# Patient Record
Sex: Female | Born: 1985 | Race: White | Hispanic: No | Marital: Single | Smoking: Former smoker
Health system: Southern US, Community
[De-identification: ages and names within clinical notes are randomized; demographics above are authoritative.]

## PROBLEM LIST (undated history)

## (undated) DIAGNOSIS — K219 Gastro-esophageal reflux disease without esophagitis: Secondary | ICD-10-CM

## (undated) DIAGNOSIS — F410 Panic disorder [episodic paroxysmal anxiety] without agoraphobia: Secondary | ICD-10-CM

## (undated) DIAGNOSIS — R51 Headache: Secondary | ICD-10-CM

## (undated) DIAGNOSIS — J45909 Unspecified asthma, uncomplicated: Secondary | ICD-10-CM

## (undated) DIAGNOSIS — G5601 Carpal tunnel syndrome, right upper limb: Secondary | ICD-10-CM

## (undated) DIAGNOSIS — R519 Headache, unspecified: Secondary | ICD-10-CM

## (undated) HISTORY — PX: DENTAL SURGERY: SHX609

## (undated) HISTORY — PX: OVARIAN CYST REMOVAL: SHX89

---

## 2006-02-05 ENCOUNTER — Emergency Department: Payer: Self-pay | Admitting: Internal Medicine

## 2006-02-14 ENCOUNTER — Emergency Department: Payer: Self-pay | Admitting: Emergency Medicine

## 2007-09-18 ENCOUNTER — Emergency Department (HOSPITAL_COMMUNITY): Admission: EM | Admit: 2007-09-18 | Discharge: 2007-09-18 | Payer: Self-pay | Admitting: Emergency Medicine

## 2009-03-17 ENCOUNTER — Emergency Department (HOSPITAL_COMMUNITY): Admission: EM | Admit: 2009-03-17 | Discharge: 2009-03-17 | Payer: Self-pay | Admitting: Emergency Medicine

## 2009-03-28 ENCOUNTER — Ambulatory Visit: Payer: Self-pay | Admitting: Unknown Physician Specialty

## 2009-03-30 ENCOUNTER — Ambulatory Visit: Payer: Self-pay | Admitting: Unknown Physician Specialty

## 2011-02-19 LAB — COMPREHENSIVE METABOLIC PANEL
Alkaline Phosphatase: 70 U/L (ref 39–117)
BUN: 9 mg/dL (ref 6–23)
Chloride: 106 mEq/L (ref 96–112)
Creatinine, Ser: 0.74 mg/dL (ref 0.4–1.2)
Glucose, Bld: 90 mg/dL (ref 70–99)
Potassium: 3.7 mEq/L (ref 3.5–5.1)
Total Bilirubin: 0.5 mg/dL (ref 0.3–1.2)
Total Protein: 6.1 g/dL (ref 6.0–8.3)

## 2011-02-19 LAB — URINALYSIS, ROUTINE W REFLEX MICROSCOPIC
Nitrite: NEGATIVE
Protein, ur: NEGATIVE mg/dL
Specific Gravity, Urine: 1.011 (ref 1.005–1.030)
Urobilinogen, UA: 0.2 mg/dL (ref 0.0–1.0)

## 2011-02-19 LAB — DIFFERENTIAL
Basophils Absolute: 0.1 10*3/uL (ref 0.0–0.1)
Basophils Relative: 1 % (ref 0–1)
Lymphocytes Relative: 19 % (ref 12–46)
Neutro Abs: 7.7 10*3/uL (ref 1.7–7.7)
Neutrophils Relative %: 72 % (ref 43–77)

## 2011-02-19 LAB — CBC
HCT: 38.8 % (ref 36.0–46.0)
Hemoglobin: 13.5 g/dL (ref 12.0–15.0)
MCV: 89.3 fL (ref 78.0–100.0)
Platelets: 228 10*3/uL (ref 150–400)
RDW: 13 % (ref 11.5–15.5)

## 2011-02-19 LAB — URINE MICROSCOPIC-ADD ON

## 2011-08-20 LAB — GC/CHLAMYDIA PROBE AMP, GENITAL
Chlamydia, DNA Probe: NEGATIVE
GC Probe Amp, Genital: NEGATIVE

## 2015-11-21 ENCOUNTER — Encounter: Payer: Self-pay | Admitting: *Deleted

## 2015-12-01 ENCOUNTER — Ambulatory Visit: Payer: Medicaid Other | Admitting: Anesthesiology

## 2015-12-01 ENCOUNTER — Ambulatory Visit
Admission: RE | Admit: 2015-12-01 | Discharge: 2015-12-01 | Disposition: A | Discharge status: Home / Self Care | Payer: Medicaid Other | Source: Ambulatory Visit | Attending: Unknown Physician Specialty | Admitting: Unknown Physician Specialty

## 2015-12-01 ENCOUNTER — Encounter
Admission: RE | Disposition: A | Discharge status: Home / Self Care | Payer: Self-pay | Source: Ambulatory Visit | Attending: Unknown Physician Specialty

## 2015-12-01 DIAGNOSIS — Z809 Family history of malignant neoplasm, unspecified: Secondary | ICD-10-CM | POA: Diagnosis not present

## 2015-12-01 DIAGNOSIS — G5601 Carpal tunnel syndrome, right upper limb: Secondary | ICD-10-CM | POA: Insufficient documentation

## 2015-12-01 DIAGNOSIS — Z91030 Bee allergy status: Secondary | ICD-10-CM | POA: Insufficient documentation

## 2015-12-01 DIAGNOSIS — Z79899 Other long term (current) drug therapy: Secondary | ICD-10-CM | POA: Insufficient documentation

## 2015-12-01 HISTORY — DX: Headache: R51

## 2015-12-01 HISTORY — DX: Carpal tunnel syndrome, right upper limb: G56.01

## 2015-12-01 HISTORY — PX: CARPAL TUNNEL RELEASE: SHX101

## 2015-12-01 HISTORY — DX: Headache, unspecified: R51.9

## 2015-12-01 HISTORY — DX: Gastro-esophageal reflux disease without esophagitis: K21.9

## 2015-12-01 SURGERY — CARPAL TUNNEL RELEASE
Anesthesia: General | Laterality: Right | Wound class: Clean

## 2015-12-01 MED ORDER — LIDOCAINE HCL (CARDIAC) 20 MG/ML IV SOLN
INTRAVENOUS | Status: DC | PRN
  Administered 2015-12-01 (×2): 50 mg via INTRATRACHEAL

## 2015-12-01 MED ORDER — ONDANSETRON HCL 4 MG/2ML IJ SOLN
INTRAMUSCULAR | Status: DC | PRN
  Administered 2015-12-01: 4 mg via INTRAVENOUS

## 2015-12-01 MED ORDER — FENTANYL CITRATE (PF) 100 MCG/2ML IJ SOLN
INTRAMUSCULAR | Status: DC | PRN
  Administered 2015-12-01: 50 ug via INTRAVENOUS

## 2015-12-01 MED ORDER — NORCO 5-325 MG PO TABS: 1.0000 | ORAL_TABLET | Freq: Four times a day (QID) | ORAL | Status: DC | PRN

## 2015-12-01 MED ORDER — PROPOFOL 10 MG/ML IV BOLUS
INTRAVENOUS | Status: DC | PRN
  Administered 2015-12-01: 150 mg via INTRAVENOUS

## 2015-12-01 MED ORDER — DEXAMETHASONE SODIUM PHOSPHATE 4 MG/ML IJ SOLN
INTRAMUSCULAR | Status: DC | PRN
  Administered 2015-12-01: 8 mg via INTRAVENOUS

## 2015-12-01 MED ORDER — BUPIVACAINE HCL (PF) 0.5 % IJ SOLN
INTRAMUSCULAR | Status: DC | PRN
  Administered 2015-12-01: 6 mL

## 2015-12-01 MED ORDER — LACTATED RINGERS IV SOLN
INTRAVENOUS | Status: DC
  Administered 2015-12-01: 07:00:00 via INTRAVENOUS

## 2015-12-01 MED ORDER — MIDAZOLAM HCL 5 MG/5ML IJ SOLN
INTRAMUSCULAR | Status: DC | PRN
  Administered 2015-12-01: 2 mg via INTRAVENOUS

## 2015-12-01 MED ORDER — ONDANSETRON HCL 4 MG/2ML IJ SOLN: 4.0000 mg | Freq: Once | INTRAMUSCULAR | Status: DC | PRN

## 2015-12-01 MED ORDER — FENTANYL CITRATE (PF) 100 MCG/2ML IJ SOLN
25.0000 ug | INTRAMUSCULAR | Status: DC | PRN
  Administered 2015-12-01: 25 ug via INTRAVENOUS

## 2015-12-01 SURGICAL SUPPLY — 27 items
BANDAGE ELASTIC 2 CLIP NS LF (GAUZE/BANDAGES/DRESSINGS) ×3 IMPLANT
BNDG ESMARK 4X12 TAN STRL LF (GAUZE/BANDAGES/DRESSINGS) ×3 IMPLANT
COVER LIGHT HANDLE FLEXIBLE (MISCELLANEOUS) ×3 IMPLANT
CUFF TOURN SGL QUICK 18 (TOURNIQUET CUFF) ×3 IMPLANT
DURAPREP 26ML APPLICATOR (WOUND CARE) ×3 IMPLANT
GAUZE SPONGE 4X4 12PLY STRL (GAUZE/BANDAGES/DRESSINGS) ×3 IMPLANT
GLOVE BIO SURGEON STRL SZ7.5 (GLOVE) ×3 IMPLANT
GLOVE BIO SURGEON STRL SZ8 (GLOVE) ×6 IMPLANT
GLOVE INDICATOR 8.0 STRL GRN (GLOVE) ×3 IMPLANT
GOWN STRL REUS W/ TWL LRG LVL3 (GOWN DISPOSABLE) ×2 IMPLANT
GOWN STRL REUS W/TWL LRG LVL3 (GOWN DISPOSABLE) ×4
KIT ROOM TURNOVER OR (KITS) ×3 IMPLANT
LOOP VESSEL RED MINI 1.3X0.9 (MISCELLANEOUS) ×1 IMPLANT
LOOPS RED MINI 1.3MMX0.9MM (MISCELLANEOUS) ×2
NS IRRIG 500ML POUR BTL (IV SOLUTION) ×3 IMPLANT
PACK EXTREMITY ARMC (MISCELLANEOUS) ×3 IMPLANT
PAD GROUND ADULT SPLIT (MISCELLANEOUS) ×3 IMPLANT
PADDING CAST 2X4YD ST (MISCELLANEOUS) ×2
PADDING CAST BLEND 2X4 STRL (MISCELLANEOUS) ×1 IMPLANT
SOL PREP PVP 2OZ (MISCELLANEOUS) ×3
SOLUTION PREP PVP 2OZ (MISCELLANEOUS) ×1 IMPLANT
SPLINT CAST 1 STEP 3X12 (MISCELLANEOUS) ×3 IMPLANT
STOCKINETTE 4X48 STRL (DRAPES) ×3 IMPLANT
STRAP BODY AND KNEE 60X3 (MISCELLANEOUS) ×3 IMPLANT
SUT ETHILON 4-0 (SUTURE) ×2
SUT ETHILON 4-0 FS2 18XMFL BLK (SUTURE) ×1
SUTURE ETHLN 4-0 FS2 18XMF BLK (SUTURE) ×1 IMPLANT

## 2015-12-01 NOTE — H&P (Signed)
  H and P reviewed. No changes. Uploaded at later date. 

## 2015-12-01 NOTE — Op Note (Signed)
DATE OF SURGERY:  12/01/2015  PATIENT NAME:  Lindsey Clark   DOB: 01-09-1986  MRN: [id number]  PRE-OPERATIVE DIAGNOSIS: Right carpal tunnel syndrome  POST-OPERATIVE DIAGNOSIS:  Same  PROCEDURE: Right carpal tunnel release  SURGEON: Dr. Erin Sons, Montez Hageman. M.D.  ANESTHESIA: Gen.   INDICATIONS FOR SURGERY: Lindsey Clark is a 30 y.o. year old female with a long history of numbness and paresthesias in the right hand. Nerve conduction studies demonstrated findings consistent with  median nerve compression.The patient had not seen any significant improvement despite conservative nonsurgical intervention. After discussion of the risks and benefits of surgical intervention, the patient expressed understanding of the risks benefits and agree with plans for carpal tunnel release.   PROCEDURE IN DETAIL: The patient was brought into the operating room and general anesthesia was performed. A tourniquet was placed on the patient's right upper arm.The right hand and arm were prepped  and draped in the usual sterile fashion. A "time-out" was performed as per usual protocol. The hand and forearm were exsanguinated using an Esmarch and the tourniquet was inflated to 250 mmHg.  An incision was made just ulnar to the thenar palmar crease. Dissection was carried down through the palmar fascia to the transverse carpal ligament. The transverse carpal ligament was sharply incised, taking care to protect the underlying structures within the carpal tunnel. Complete release of the transverse carpal ligament was achieved. There was no evidence of a mass or proliferative synovitis within the carpal tunnel. I also explored the ulnar nerve at the level of the incision and freed it up from the surrounding soft tissue. The wound was irrigated with saline. The tourniquet was released at this time. It had been up for about 10 minutes. Bleeding was controlled with coagulation cautery. I did inject the subcutaneous tissue of the  wound with about 10 cc of 0.5% Marcaine without epinephrine. The skin was then re-approximated with interrupted sutures of #4-0 nylon. A sterile dressing was applied followed by application of a volar splint.  The patient tolerated the procedure well and was transported to the PACU in stable condition.  Dr. Isidoro Donning. M.D.

## 2015-12-01 NOTE — Anesthesia Postprocedure Evaluation (Signed)
Anesthesia Post Note  Patient: Lindsey Clark  Procedure(s) Performed: Procedure(s) (LRB): CARPAL TUNNEL RELEASE (Right)  Patient location during evaluation: PACU Anesthesia Type: General Level of consciousness: awake and alert and oriented Pain management: satisfactory to patient Vital Signs Assessment: post-procedure vital signs reviewed and stable Respiratory status: spontaneous breathing, nonlabored ventilation and respiratory function stable Cardiovascular status: blood pressure returned to baseline and stable Postop Assessment: Adequate PO intake and No signs of nausea or vomiting Anesthetic complications: no    Cherly Beach

## 2015-12-01 NOTE — Discharge Instructions (Signed)
General Anesthesia, Adult, Care After °Refer to this sheet in the next few weeks. These instructions provide you with information on caring for yourself after your procedure. Your health care provider may also give you more specific instructions. Your treatment has been planned according to current medical practices, but problems sometimes occur. Call your health care provider if you have any problems or questions after your procedure. °WHAT TO EXPECT AFTER THE PROCEDURE °After the procedure, it is typical to experience: °· Sleepiness. °· Nausea and vomiting. °HOME CARE INSTRUCTIONS °· For the first 24 hours after general anesthesia: °¨ Have a responsible person with you. °¨ Do not drive a car. If you are alone, do not take public transportation. °¨ Do not drink alcohol. °¨ Do not take medicine that has not been prescribed by your health care provider. °¨ Do not sign important papers or make important decisions. °¨ You may resume a normal diet and activities as directed by your health care provider. °· Change bandages (dressings) as directed. °· If you have questions or problems that seem related to general anesthesia, call the hospital and ask for the anesthetist or anesthesiologist on call. °SEEK MEDICAL CARE IF: °· You have nausea and vomiting that continue the day after anesthesia. °· You develop a rash. °SEEK IMMEDIATE MEDICAL CARE IF:  °· You have difficulty breathing. °· You have chest pain. °· You have any allergic problems. °  °This information is not intended to replace advice given to you by your health care provider. Make sure you discuss any questions you have with your health care provider. °  °Document Released: 02/03/2001 Document Revised: 11/18/2014 Document Reviewed: 02/26/2012 °Elsevier Interactive Patient Education ©2016 [employer] ° °Ice pack  ° °Elevation ° °Keep dressing dry  ° °RTC in about 10 days °

## 2015-12-01 NOTE — Transfer of Care (Signed)
Immediate Anesthesia Transfer of Care Note  Patient: Lindsey Clark  Procedure(s) Performed: Procedure(s): CARPAL TUNNEL RELEASE (Right)  Patient Location: PACU  Anesthesia Type: General LMA  Level of Consciousness: awake, alert  and patient cooperative  Airway and Oxygen Therapy: Patient Spontanous Breathing and Patient connected to supplemental oxygen  Post-op Assessment: Post-op Vital signs reviewed, Patient's Cardiovascular Status Stable, Respiratory Function Stable, Patent Airway and No signs of Nausea or vomiting  Post-op Vital Signs: Reviewed and stable  Complications: No apparent anesthesia complications

## 2015-12-01 NOTE — Anesthesia Procedure Notes (Signed)
Procedure Name: LMA Insertion Date/Time: 12/01/2015 7:37 AM Performed by: Andee Poles Pre-anesthesia Checklist: Patient identified, Emergency Drugs available, Suction available, Timeout performed and Patient being monitored Patient Re-evaluated:Patient Re-evaluated prior to inductionOxygen Delivery Method: Circle system utilized Preoxygenation: Pre-oxygenation with 100% oxygen Intubation Type: IV induction LMA: LMA inserted LMA Size: 4.0 Number of attempts: 1 Placement Confirmation: positive ETCO2 and breath sounds checked- equal and bilateral Tube secured with: Tape

## 2015-12-01 NOTE — Anesthesia Preprocedure Evaluation (Signed)
Anesthesia Evaluation  Patient identified by MRN, date of birth, ID band  Reviewed: Allergy & Precautions, H&P , NPO status , Patient's Chart, lab work & pertinent test results  Airway Mallampati: II  TM Distance: >3 FB Neck ROM: full    Dental no notable dental hx.    Pulmonary former smoker,    Pulmonary exam normal        Cardiovascular  Rhythm:regular Rate:Normal     Neuro/Psych    GI/Hepatic GERD  ,  Endo/Other    Renal/GU      Musculoskeletal   Abdominal   Peds  Hematology   Anesthesia Other Findings   Reproductive/Obstetrics                             Anesthesia Physical Anesthesia Plan  ASA: II  Anesthesia Plan: General LMA   Post-op Pain Management:    Induction:   Airway Management Planned:   Additional Equipment:   Intra-op Plan:   Post-operative Plan:   Informed Consent: I have reviewed the patients History and Physical, chart, labs and discussed the procedure including the risks, benefits and alternatives for the proposed anesthesia with the patient or authorized representative who has indicated his/her understanding and acceptance.     Plan Discussed with: CRNA  Anesthesia Plan Comments:         Anesthesia Quick Evaluation

## 2015-12-04 ENCOUNTER — Encounter: Payer: Self-pay | Admitting: Unknown Physician Specialty

## 2016-11-20 ENCOUNTER — Other Ambulatory Visit: Payer: Self-pay | Admitting: Surgery

## 2016-11-20 DIAGNOSIS — M67821 Other specified disorders of synovium, right elbow: Secondary | ICD-10-CM

## 2016-12-03 ENCOUNTER — Ambulatory Visit
Admission: RE | Admit: 2016-12-03 | Discharge: 2016-12-03 | Disposition: A | Discharge status: Home / Self Care | Payer: Managed Care, Other (non HMO) | Source: Ambulatory Visit | Attending: Surgery | Admitting: Surgery

## 2016-12-03 DIAGNOSIS — M67821 Other specified disorders of synovium, right elbow: Secondary | ICD-10-CM | POA: Diagnosis not present

## 2016-12-23 ENCOUNTER — Encounter: Payer: Self-pay | Admitting: *Deleted

## 2016-12-25 ENCOUNTER — Ambulatory Visit: Payer: Managed Care, Other (non HMO) | Admitting: Anesthesiology

## 2016-12-25 ENCOUNTER — Ambulatory Visit
Admission: RE | Admit: 2016-12-25 | Discharge: 2016-12-25 | Disposition: A | Discharge status: Home / Self Care | Payer: Managed Care, Other (non HMO) | Source: Ambulatory Visit | Attending: Surgery | Admitting: Surgery

## 2016-12-25 ENCOUNTER — Encounter
Admission: RE | Disposition: A | Discharge status: Home / Self Care | Payer: Self-pay | Source: Ambulatory Visit | Attending: Surgery

## 2016-12-25 DIAGNOSIS — M25521 Pain in right elbow: Secondary | ICD-10-CM | POA: Diagnosis present

## 2016-12-25 DIAGNOSIS — K219 Gastro-esophageal reflux disease without esophagitis: Secondary | ICD-10-CM | POA: Diagnosis not present

## 2016-12-25 DIAGNOSIS — Z87891 Personal history of nicotine dependence: Secondary | ICD-10-CM | POA: Diagnosis not present

## 2016-12-25 DIAGNOSIS — Z91030 Bee allergy status: Secondary | ICD-10-CM | POA: Insufficient documentation

## 2016-12-25 DIAGNOSIS — Z79899 Other long term (current) drug therapy: Secondary | ICD-10-CM | POA: Diagnosis not present

## 2016-12-25 DIAGNOSIS — M67821 Other specified disorders of synovium, right elbow: Secondary | ICD-10-CM | POA: Insufficient documentation

## 2016-12-25 DIAGNOSIS — Z791 Long term (current) use of non-steroidal anti-inflammatories (NSAID): Secondary | ICD-10-CM | POA: Diagnosis not present

## 2016-12-25 DIAGNOSIS — Z9889 Other specified postprocedural states: Secondary | ICD-10-CM | POA: Diagnosis not present

## 2016-12-25 HISTORY — PX: ELBOW ARTHROSCOPY: SHX614

## 2016-12-25 SURGERY — ARTHROSCOPY, ELBOW, WITH OPEN SURGERY IF INDICATED
Anesthesia: Regional | Site: Elbow | Laterality: Right | Wound class: Clean

## 2016-12-25 MED ORDER — LIDOCAINE HCL (CARDIAC) 20 MG/ML IV SOLN
INTRAVENOUS | Status: DC | PRN
  Administered 2016-12-25: 40 mg via INTRAVENOUS

## 2016-12-25 MED ORDER — DEXTROSE 5 % IV SOLN
2000.0000 mg | Freq: Once | INTRAVENOUS | Status: AC
  Administered 2016-12-25: 2000 mg via INTRAVENOUS

## 2016-12-25 MED ORDER — ROPIVACAINE HCL 5 MG/ML IJ SOLN
INTRAMUSCULAR | Status: DC | PRN
  Administered 2016-12-25: 35 mL via PERINEURAL

## 2016-12-25 MED ORDER — HYDROCODONE-ACETAMINOPHEN 5-325 MG PO TABS: 1.0000 | ORAL_TABLET | ORAL | Status: DC | PRN

## 2016-12-25 MED ORDER — ONDANSETRON HCL 4 MG/2ML IJ SOLN: 4.0000 mg | Freq: Once | INTRAMUSCULAR | Status: DC | PRN

## 2016-12-25 MED ORDER — POTASSIUM CHLORIDE IN NACL 20-0.9 MEQ/L-% IV SOLN: INTRAVENOUS | Status: DC

## 2016-12-25 MED ORDER — BUPIVACAINE HCL 0.25 % IJ SOLN
INTRAMUSCULAR | Status: DC | PRN
  Administered 2016-12-25: 30 mL

## 2016-12-25 MED ORDER — ONDANSETRON HCL 4 MG/2ML IJ SOLN
INTRAMUSCULAR | Status: DC | PRN
  Administered 2016-12-25: 4 mg via INTRAVENOUS

## 2016-12-25 MED ORDER — LACTATED RINGERS IV SOLN: 500.0000 mL | INTRAVENOUS | Status: DC

## 2016-12-25 MED ORDER — DEXAMETHASONE SODIUM PHOSPHATE 4 MG/ML IJ SOLN
INTRAMUSCULAR | Status: DC | PRN
  Administered 2016-12-25: 4 mg via INTRAVENOUS

## 2016-12-25 MED ORDER — METOCLOPRAMIDE HCL 5 MG/ML IJ SOLN: 5.0000 mg | Freq: Three times a day (TID) | INTRAMUSCULAR | Status: DC | PRN

## 2016-12-25 MED ORDER — HYDROCODONE-ACETAMINOPHEN 5-325 MG PO TABS: 1.0000 | ORAL_TABLET | Freq: Four times a day (QID) | ORAL | 0 refills | Status: DC | PRN

## 2016-12-25 MED ORDER — ONDANSETRON HCL 4 MG PO TABS: 4.0000 mg | ORAL_TABLET | Freq: Four times a day (QID) | ORAL | Status: DC | PRN

## 2016-12-25 MED ORDER — EPINEPHRINE PF 1 MG/ML IJ SOLN
INTRAMUSCULAR | Status: DC | PRN
  Administered 2016-12-25: .15 mL via INTRAMUSCULAR

## 2016-12-25 MED ORDER — PROPOFOL 10 MG/ML IV BOLUS
INTRAVENOUS | Status: DC | PRN
Start: 2016-12-25 — End: 2016-12-25
  Administered 2016-12-25: 50 mg via INTRAVENOUS
  Administered 2016-12-25: 150 mg via INTRAVENOUS

## 2016-12-25 MED ORDER — ACETAMINOPHEN 325 MG PO TABS: 325.0000 mg | ORAL_TABLET | ORAL | Status: DC | PRN

## 2016-12-25 MED ORDER — ONDANSETRON HCL 4 MG/2ML IJ SOLN: 4.0000 mg | Freq: Four times a day (QID) | INTRAMUSCULAR | Status: DC | PRN

## 2016-12-25 MED ORDER — GLYCOPYRROLATE 0.2 MG/ML IJ SOLN
INTRAMUSCULAR | Status: DC | PRN
  Administered 2016-12-25: 0.1 mg via INTRAVENOUS

## 2016-12-25 MED ORDER — ACETAMINOPHEN 160 MG/5ML PO SOLN: 325.0000 mg | ORAL | Status: DC | PRN

## 2016-12-25 MED ORDER — OXYCODONE HCL 5 MG PO TABS: 5.0000 mg | ORAL_TABLET | Freq: Once | ORAL | Status: DC | PRN

## 2016-12-25 MED ORDER — LACTATED RINGERS IV SOLN
INTRAVENOUS | Status: DC | PRN
  Administered 2016-12-25: 1200 mL

## 2016-12-25 MED ORDER — OXYCODONE HCL 5 MG/5ML PO SOLN: 5.0000 mg | Freq: Once | ORAL | Status: DC | PRN

## 2016-12-25 MED ORDER — MIDAZOLAM HCL 2 MG/2ML IJ SOLN
INTRAMUSCULAR | Status: DC | PRN
  Administered 2016-12-25 (×2): 2 mg via INTRAVENOUS

## 2016-12-25 MED ORDER — METOCLOPRAMIDE HCL 5 MG PO TABS: 5.0000 mg | ORAL_TABLET | Freq: Three times a day (TID) | ORAL | Status: DC | PRN

## 2016-12-25 MED ORDER — LACTATED RINGERS IV SOLN
INTRAVENOUS | Status: DC
  Administered 2016-12-25: 13:00:00 via INTRAVENOUS

## 2016-12-25 MED ORDER — FENTANYL CITRATE (PF) 100 MCG/2ML IJ SOLN: 25.0000 ug | INTRAMUSCULAR | Status: DC | PRN

## 2016-12-25 MED ORDER — FENTANYL CITRATE (PF) 100 MCG/2ML IJ SOLN
INTRAMUSCULAR | Status: DC | PRN
  Administered 2016-12-25: 100 ug via INTRAVENOUS

## 2016-12-25 MED ORDER — LIDOCAINE HCL 4 % MT SOLN
OROMUCOSAL | Status: DC | PRN
  Administered 2016-12-25: 4 mL via TOPICAL

## 2016-12-25 MED ORDER — ROCURONIUM BROMIDE 100 MG/10ML IV SOLN
INTRAVENOUS | Status: DC | PRN
  Administered 2016-12-25: 20 mg via INTRAVENOUS

## 2016-12-25 SURGICAL SUPPLY — 38 items
BANDAGE ELASTIC 4 CLIP ST LF (GAUZE/BANDAGES/DRESSINGS) ×3 IMPLANT
BLADE EAR TYMPAN 2.5 60D BEAV (BLADE) ×3 IMPLANT
BLADE FULL RADIUS 3.5 (BLADE) ×3 IMPLANT
BNDG ESMARK 4X12 TAN STRL LF (GAUZE/BANDAGES/DRESSINGS) ×3 IMPLANT
BUR ABRADER 4.0 W/FLUTE AQUA (MISCELLANEOUS) IMPLANT
BURR ABRADER 4.0 W/FLUTE AQUA (MISCELLANEOUS)
CANNULA SHAVER SCOPE W/RIB (CANNULA) ×3 IMPLANT
CHLORAPREP W/TINT 26ML (MISCELLANEOUS) ×6 IMPLANT
COVER LIGHT HANDLE UNIVERSAL (MISCELLANEOUS) ×6 IMPLANT
CUFF TOURN SGL QUICK 18 (TOURNIQUET CUFF) ×3 IMPLANT
DRAPE IMP U-DRAPE 54X76 (DRAPES) ×3 IMPLANT
DRAPE SHEET LG 3/4 BI-LAMINATE (DRAPES) ×3 IMPLANT
DRAPE SURG 17X11 SM STRL (DRAPES) ×3 IMPLANT
GAUZE SPONGE 4X4 12PLY STRL (GAUZE/BANDAGES/DRESSINGS) ×3 IMPLANT
GLOVE BIO SURGEON STRL SZ8 (GLOVE) ×6 IMPLANT
GLOVE INDICATOR 8.0 STRL GRN (GLOVE) ×3 IMPLANT
GOWN STRL REUS W/ TWL LRG LVL3 (GOWN DISPOSABLE) ×1 IMPLANT
GOWN STRL REUS W/ TWL XL LVL3 (GOWN DISPOSABLE) ×1 IMPLANT
GOWN STRL REUS W/TWL LRG LVL3 (GOWN DISPOSABLE) ×2
GOWN STRL REUS W/TWL XL LVL3 (GOWN DISPOSABLE) ×2
IV LACTATED RINGER IRRG 3000ML (IV SOLUTION) ×2
IV LR IRRIG 3000ML ARTHROMATIC (IV SOLUTION) ×1 IMPLANT
KIT ROOM TURNOVER OR (KITS) ×3 IMPLANT
MANIFOLD 4PT FOR NEPTUNE1 (MISCELLANEOUS) ×3 IMPLANT
MAT BLUE FLOOR 46X72 FLO (MISCELLANEOUS) ×3 IMPLANT
NEEDLE 18GX1X1/2 (RX/OR ONLY) (NEEDLE) ×3 IMPLANT
NEEDLE HYPO 21X1.5 SAFETY (NEEDLE) ×3 IMPLANT
PACK ARTHROSCOPY KNEE (MISCELLANEOUS) ×3 IMPLANT
PAD GROUND ADULT SPLIT (MISCELLANEOUS) ×3 IMPLANT
SLING ARM LRG DEEP (SOFTGOODS) ×3 IMPLANT
SLING ARM M TX990204 (SOFTGOODS) IMPLANT
STRAP BODY AND KNEE 60X3 (MISCELLANEOUS) ×6 IMPLANT
SUT PROLENE 4 0 PS 2 18 (SUTURE) ×6 IMPLANT
SUT VIC AB 3-0 SH 27 (SUTURE)
SUT VIC AB 3-0 SH 27X BRD (SUTURE) IMPLANT
SYR 20CC LL (SYRINGE) ×3 IMPLANT
TUBING ARTHRO INFLOW-ONLY STRL (TUBING) ×3 IMPLANT
WAND HAND CNTRL MULTIVAC 90 (MISCELLANEOUS) IMPLANT

## 2016-12-25 NOTE — Discharge Instructions (Signed)
General Anesthesia, Adult, Care After These instructions provide you with information about caring for yourself after your procedure. Your health care provider may also give you more specific instructions. Your treatment has been planned according to current medical practices, but problems sometimes occur. Call your health care provider if you have any problems or questions after your procedure. What can I expect after the procedure? After the procedure, it is common to have:  Vomiting.  A sore throat.  Mental slowness. It is common to feel:  Nauseous.  Cold or shivery.  Sleepy.  Tired.  Sore or achy, even in parts of your body where you did not have surgery. Follow these instructions at home: For at least 24 hours after the procedure:  Do not:  Participate in activities where you could fall or become injured.  Drive.  Use heavy machinery.  Drink alcohol.  Take sleeping pills or medicines that cause drowsiness.  Make important decisions or sign legal documents.  Take care of children on your own.  Rest. Eating and drinking  If you vomit, drink water, juice, or soup when you can drink without vomiting.  Drink enough fluid to keep your urine clear or pale yellow.  Make sure you have little or no nausea before eating solid foods.  Follow the diet recommended by your health care provider. General instructions  Have a responsible adult stay with you until you are awake and alert.  Return to your normal activities as told by your health care provider. Ask your health care provider what activities are safe for you.  Take over-the-counter and prescription medicines only as told by your health care provider.  If you smoke, do not smoke without supervision.  Keep all follow-up visits as told by your health care provider. This is important. Contact a health care provider if:  You continue to have nausea or vomiting at home, and medicines are not helpful.  You  cannot drink fluids or start eating again.  You cannot urinate after 8-12 hours.  You develop a skin rash.  You have fever.  You have increasing redness at the site of your procedure. Get help right away if:  You have difficulty breathing.  You have chest pain.  You have unexpected bleeding.  You feel that you are having a life-threatening or urgent problem. This information is not intended to replace advice given to you by your health care provider. Make sure you discuss any questions you have with your health care provider. Document Released: 02/03/2001 Document Revised: 04/01/2016 Document Reviewed: 10/12/2015 Elsevier Interactive Patient Education  2017 [employer]  Keep dressing dry and intact. Use sling as needed for comfort for the first 3-4 days then try to wean out of it. Keep right arm elevated above heart level. May shower after dressing removed on postop day 4 (Sunday). Cover sutures with Band-Aids after drying off. Apply ice to affected area frequently. Take Meloxicam BID with meals for 7-10 days, then as necessary. Take pain medication as prescribed when needed.  Return for follow-up in 10-14 days or as scheduled.

## 2016-12-25 NOTE — Anesthesia Postprocedure Evaluation (Signed)
Anesthesia Post Note  Patient: Lindsey Clark  Procedure(s) Performed: Procedure(s) (LRB): ARTHROSCOPY ELBOW  with debridement of a symptomatic plica right (Right)  Patient location during evaluation: PACU Anesthesia Type: Regional, General and MAC Level of consciousness: awake and alert Pain management: pain level controlled Vital Signs Assessment: post-procedure vital signs reviewed and stable Respiratory status: spontaneous breathing, nonlabored ventilation and respiratory function stable Cardiovascular status: blood pressure returned to baseline and stable Postop Assessment: no signs of nausea or vomiting Anesthetic complications: no    DANIEL D KOVACS

## 2016-12-25 NOTE — Op Note (Signed)
12/25/2016  3:26 PM  Patient:   CAEDYN TASSINARI  Pre-Op Diagnosis:   Symptomatic posterolateral plica, right elbow.  Post-Op Diagnosis:   Same.  Procedure:   Arthroscopic debridement of symptomatic plica, right elbow.  Surgeon:   Maryagnes Amos, MD  Assistant:   Ellwood Dense, PA-S  Anesthesia:   General laryngomask anesthesia with an interscalene block placed preoperatively by the anesthesiologist.  Findings:   As above. The articular surfaces of the humerus, ulna, and radial head all were in excellent condition.  Complications:   None  EBL:   3 cc  Fluids:   900 cc crystalloid  TT:   56 minutes at 250 mmHg  Drains:   None  Closure:   4-0 Prolene interrupted sutures  Brief Clinical Note:   The patient is a 31 year old female with a history of posterolateral right elbow pain. Her symptoms have persisted despite medications, activity modification, steroid injections, etc. Her history and examination are suspicious for a symptomatic posterolateral plica of the right elbow. An MRI scan was inconclusive for any intra-articular pathology. The patient presents this time for arthroscopy and debridement of the symptomatic suprapatellar plica.  Procedure:   The patient underwent placement of an interscalene block in the preoperative holding area by anesthesia before being brought into the operating room and lain in the supine position. After adequate general laryngal mask anesthesia was obtained, the patient was repositioned in the prone position with care taken to be sure the chest and these were well padded and that the neck was not unduly extended. A tourniquet was placed around the upper arm before the arm was secured to the arm holder using a 4 inch Coban. After verifying the appropriate surgical site with a timeout, a solution of 30 cc of 0.25% Sensorcaine with epinephrine was injected into the right elbow joint using sterile technique. The right upper extremity was prepped with  ChloraPrep solution before being draped sterilely. Preoperative antibiotics were administered. The limb was exsanguinated with an Esmarch and the tourniquet inflated to 250 mmHg. A proximal anteromedial portal was created using an outside-in technique at a spot 2 cm proximal and 1 cm anterior to the medial epicondyle. The skin was incised with a #15 blade before blunt dissection was carried down through the subcutaneous tissues using a hemostat. The intra-articular aspect of the elbow was carefully inspected with the findings as described above. A separate anterolateral portal was created 2 cm proximal and 1 center anterior to the lateral epicondyle using an outside-in technique. Reactive synovial tissues anteriorly were debrided using the full-radius resector. The instruments were then removed from the anterior compartment.  A posterolateral portal was created 3 cm proximal to the tip of the olecranon along the lateral border of the triceps tendon before the camera was inserted. A second posterolateral portal was created more distally level with the tip of the olecranon. The 3.5 mm full-radius resector was inserted through the more distal postero-lateral portal site and synovial tissues debrided from the olecranon fossa as well as along the posterolateral gutter, removing the plica in the process. The camera was then placed in the posterolateral portal and the shaver introduced through the more proximal posterolateral portal. Additional debridement was carried out across the olecranon fossa and into the more proximal portion of the ulnar gutter. The instruments were removed from the posterior compartment after suctioning the excess fluid.   The portal sites were reapproximated using 4-0 Prolene interrupted sutures before a sterile bulky dressings were applied to  the elbow. The patient was rolled back in the supine position on her stretcher before arm was placed into a standard sling for comfort. Subsequently,  the patient was extubated and returned to the recovery room in satisfactory condition after tolerating the procedure well.

## 2016-12-25 NOTE — Anesthesia Preprocedure Evaluation (Signed)
Anesthesia Evaluation  Patient identified by MRN, date of birth, ID band Patient awake    Reviewed: Allergy & Precautions, H&P , NPO status , Patient's Chart, lab work & pertinent test results, reviewed documented beta blocker date and time   Airway Mallampati: II  TM Distance: >3 FB Neck ROM: full    Dental no notable dental hx.    Pulmonary former smoker,    Pulmonary exam normal breath sounds clear to auscultation       Cardiovascular Exercise Tolerance: Good negative cardio ROS   Rhythm:regular Rate:Normal     Neuro/Psych  Headaches,  Neuromuscular disease negative psych ROS   GI/Hepatic Neg liver ROS, GERD  Medicated,  Endo/Other  negative endocrine ROS  Renal/GU negative Renal ROS  negative genitourinary   Musculoskeletal   Abdominal   Peds  Hematology negative hematology ROS (+)   Anesthesia Other Findings   Reproductive/Obstetrics negative OB ROS                             Anesthesia Physical Anesthesia Plan  ASA: II  Anesthesia Plan: General ETT and Regional   Post-op Pain Management:    Induction:   Airway Management Planned:   Additional Equipment:   Intra-op Plan:   Post-operative Plan:   Informed Consent:   Plan Discussed with: CRNA  Anesthesia Plan Comments:         Anesthesia Quick Evaluation

## 2016-12-25 NOTE — Transfer of Care (Signed)
Immediate Anesthesia Transfer of Care Note  Patient: Lindsey Clark  Procedure(s) Performed: Procedure(s) with comments: ARTHROSCOPY ELBOW  with debridement of a symptomatic plica right (Right) - Lateral Position, beanbag on table and separate support for arm standard arthroscopy equipment right  Patient Location: PACU  Anesthesia Type: General ETT, Regional  Level of Consciousness: awake, alert  and patient cooperative  Airway and Oxygen Therapy: Patient Spontanous Breathing and Patient connected to supplemental oxygen  Post-op Assessment: Post-op Vital signs reviewed, Patient's Cardiovascular Status Stable, Respiratory Function Stable, Patent Airway and No signs of Nausea or vomiting  Post-op Vital Signs: Reviewed and stable  Complications: No apparent anesthesia complications

## 2016-12-25 NOTE — Anesthesia Procedure Notes (Signed)
Procedure Name: Intubation Date/Time: 12/25/2016 1:54 PM Performed by: Jimmy PicketAMYOT, Jerimy Johanson Pre-anesthesia Checklist: Patient identified, Emergency Drugs available, Suction available, Patient being monitored and Timeout performed Patient Re-evaluated:Patient Re-evaluated prior to inductionOxygen Delivery Method: Circle system utilized Preoxygenation: Pre-oxygenation with 100% oxygen Intubation Type: IV induction Ventilation: Mask ventilation without difficulty Laryngoscope Size: Miller and 2 Grade View: Grade I Tube type: Oral Tube size: 7.0 mm Number of attempts: 2 Placement Confirmation: ETT inserted through vocal cords under direct vision,  positive ETCO2 and breath sounds checked- equal and bilateral Tube secured with: Tape Dental Injury: Teeth and Oropharynx as per pre-operative assessment  Comments: Unable to pass ETT on initial DL. Pt appeared anterior. Repositioned pt head and inserted 7.0 ETT without difficulty, Grade I view.

## 2016-12-25 NOTE — H&P (Signed)
Paper H&P to be scanned into permanent record. H&P reviewed. No changes. 

## 2016-12-25 NOTE — Anesthesia Procedure Notes (Signed)
Performed by: Romeka Scifres       

## 2016-12-26 ENCOUNTER — Encounter: Payer: Self-pay | Admitting: Surgery

## 2017-05-21 NOTE — H&P (Signed)
Lindsey Clark is a 31 y.o. female here TVH and bilateral salpingectomy  EMBX and SIS both normal  Pap 02/2017 . wnl  Pt here for  f/up for menorrhagia with irregular cycle . Pt has been bleeding  For a >7 yrs h/o heavy menstrual flow . Bleeds 13-15 days / month . + clots . She has tried many different OCP without success.  S/P one SVD . Desies no more children . Not sexually active . She wants sometime definitively done   Past Medical History:  has a past medical history of Carpal tunnel syndrome of right wrist.  Past Surgical History:  has a past surgical history that includes ovarian cyst excision (Left, 2009); carpal tunnel release (Right, 12/01/2015); and Arthropscopic debridment of symptomatic plica right elbow  (Right, 12/25/2016). Family History: family history includes Bone cancer in her maternal grandmother; Breast cancer in her maternal aunt and maternal grandmother; Cancer in her paternal grandfather; Heart disease in her paternal aunt; Lung cancer in her paternal aunt and paternal grandmother; Ovarian cancer in her maternal aunt; Skin cancer in her maternal uncle. Social History:  reports that she quit smoking about 8 years ago. She has never used smokeless tobacco. She reports that she drinks about 6.0 oz of alcohol per week . She reports that she does not use drugs. OB/GYN History:          OB History    Gravida Para Term Preterm AB Living   1 1       1    SAB TAB Ectopic Molar Multiple Live Births             1      Allergies: is allergic to venom-honey bee. Medications:  Current Outpatient Prescriptions:  .  HYDROcodone-acetaminophen (NORCO) 5-325 mg tablet, Take 1 tablet by mouth every 6 (six) hours as needed for Pain., Disp: 30 tablet, Rfl: 0 .  omeprazole (PRILOSEC) 40 MG DR capsule, Take 40 mg by mouth once daily., Disp: , Rfl:  .  SUMAtriptan (IMITREX) 25 MG tablet, take 1 tablet by mouth immediately may repeat after 2 hours if needed MAX OF 200MG  PER DAY, Disp: ,  Rfl: 0  Review of Systems: General:                      No fatigue or weight loss Eyes:                           No vision changes Ears:                            No hearing difficulty Respiratory:                No cough or shortness of breath Pulmonary:                  No asthma or shortness of breath Cardiovascular:           No chest pain, palpitations, dyspnea on exertion Gastrointestinal:          No abdominal bloating, chronic diarrhea, constipations, masses, pain or hematochezia Genitourinary:             No hematuria, dysuria, abnormal vaginal discharge, pelvic pain,++ Menometrorrhagia Lymphatic:                   No swollen lymph nodes Musculoskeletal:  No muscle weakness Neurologic:                  No extremity weakness, syncope, seizure disorder Psychiatric:                  No history of depression, delusions or suicidal/homicidal ideation    Exam:      Vitals:   03/25/17 1055  BP: (!) 126/96  Pulse: 77    Body mass index is 31.64 kg/m.  WDWN white/ female in NAD   Lungs: CTA  CV : RRR without murmur    Neck:  no thyromegaly Abdomen: soft , no mass, normal active bowel sounds,  non-tender, no rebound tenderness Pelvic: tanner stage 5 ,  External genitalia: vulva /labia no lesions Urethra: no prolapse Vagina: normal physiologic d/c, adequate for TVH if need be  Cervix: no lesions, no cervical motion tenderness   Uterus: normal size shape and contour, non-tender Adnexa: no mass,  non-tender     Impression:   The primary encounter diagnosis was Menorrhagia with irregular cycle. Desires definitive treatment  Plan:   I have spoken with the patient regarding treatment options including expectant management, hormonal options, or surgical intervention. Including TVH + LSH , ablative procedure s Novasure and Her option  . Pro and cons discussed for each .   Pt has elected for [facility] and bilateral salpingectomy  The risks of the procedure  have discussed     Orders Placed This Encounter  Procedures             THOMAS Elenora Gamma, MD

## 2017-05-26 ENCOUNTER — Encounter: Payer: Self-pay | Admitting: *Deleted

## 2017-05-26 ENCOUNTER — Encounter
Admission: RE | Admit: 2017-05-26 | Discharge: 2017-05-26 | Disposition: A | Discharge status: Home / Self Care | Payer: Managed Care, Other (non HMO) | Source: Ambulatory Visit | Attending: Obstetrics and Gynecology | Admitting: Obstetrics and Gynecology

## 2017-05-26 DIAGNOSIS — Z0183 Encounter for blood typing: Secondary | ICD-10-CM | POA: Insufficient documentation

## 2017-05-26 DIAGNOSIS — N920 Excessive and frequent menstruation with regular cycle: Secondary | ICD-10-CM | POA: Insufficient documentation

## 2017-05-26 DIAGNOSIS — Z01812 Encounter for preprocedural laboratory examination: Secondary | ICD-10-CM | POA: Insufficient documentation

## 2017-05-26 NOTE — Patient Instructions (Signed)
  Your procedure is scheduled on: 06/02/17 Report to Day Surgery.[facility]OND [facility]LOOR To find out your arrival time please call [phone] between 1PM - 3PM on 05/30/17.  Remember: Instructions that are not followed completely may result in serious medical risk, up to and including death, or upon the discretion of your surgeon and anesthesiologist your surgery may need to be rescheduled.    _X___ 1. Do not eat food or drink liquids after midnight. No gum chewing or hard candies.     __X__ 2. No Alcohol for 24 hours before or after surgery.   ____ 3. Do Not Smoke For 24 Hours Prior to Your Surgery.   ____ 4. Bring all medications with you on the day of surgery if instructed.    _X___ 5. Notify your doctor if there is any change in your medical condition     (cold, fever, infections).       Do not wear jewelry, make-up, hairpins, clips or nail polish.  Do not wear lotions, powders, or perfumes. You may wear deodorant.  Do not shave 48 hours prior to surgery. Men may shave face and neck.  Do not bring valuables to the hospital.    [facility] any belongings or valuables.               Contacts, dentures or bridgework may not be worn into surgery.  Leave your suitcase in the car. After surgery it may be brought to your room.  For patients admitted to the hospital, discharge time is determined by your                treatment team.   Patients discharged the day of surgery will not be allowed to drive home.   _X___ Take these medicines the morning of surgery with A SIP OF WATER:    1. OMEPRAZOLE  2.   3.   4.  5.  6.  __X__ Fleet Enema (as directed) AT LEAST 1 HOUR BEFORE LEAVING FOR HOSPITAL  ____ Use CHG Soap as directed  _X___ Use inhalers on the day of surgery  ____ Stop metformin 2 days prior to surgery    ____ Take 1/2 of usual insulin dose the night before surgery and none on the morning of surgery.   ____ Stop Coumadin/Plavix/aspirin  on   ____ Stop Anti-inflammatories on    ____ Stop supplements until after surgery.    ____ Bring C-Pap to the hospital.

## 2017-05-28 ENCOUNTER — Encounter
Admission: RE | Admit: 2017-05-28 | Discharge: 2017-05-28 | Disposition: A | Discharge status: Home / Self Care | Payer: Managed Care, Other (non HMO) | Source: Ambulatory Visit | Attending: Obstetrics and Gynecology | Admitting: Obstetrics and Gynecology

## 2017-05-28 DIAGNOSIS — Z01812 Encounter for preprocedural laboratory examination: Secondary | ICD-10-CM | POA: Diagnosis not present

## 2017-05-28 DIAGNOSIS — N920 Excessive and frequent menstruation with regular cycle: Secondary | ICD-10-CM | POA: Diagnosis not present

## 2017-05-28 DIAGNOSIS — Z0183 Encounter for blood typing: Secondary | ICD-10-CM | POA: Diagnosis not present

## 2017-05-28 LAB — CBC
HCT: 36.5 % (ref 35.0–47.0)
Hemoglobin: 12.3 g/dL (ref 12.0–16.0)
MCH: 27.7 pg (ref 26.0–34.0)
MCHC: 33.8 g/dL (ref 32.0–36.0)
MCV: 82.1 fL (ref 80.0–100.0)
PLATELETS: 292 10*3/uL (ref 150–440)
RBC: 4.45 MIL/uL (ref 3.80–5.20)
RDW: 15 % — AB (ref 11.5–14.5)
WBC: 8.5 10*3/uL (ref 3.6–11.0)

## 2017-05-28 LAB — BASIC METABOLIC PANEL
Anion gap: 7 (ref 5–15)
BUN: 8 mg/dL (ref 6–20)
CHLORIDE: 106 mmol/L (ref 101–111)
CO2: 27 mmol/L (ref 22–32)
CREATININE: 0.81 mg/dL (ref 0.44–1.00)
Calcium: 9 mg/dL (ref 8.9–10.3)
GFR calc Af Amer: >60 mL/min (ref >60)
Glucose, Bld: 83 mg/dL (ref 65–99)
Potassium: 3.8 mmol/L (ref 3.5–5.1)
SODIUM: 140 mmol/L (ref 135–145)

## 2017-05-28 LAB — TYPE AND SCREEN
ABO/RH(D): A POS
Antibody Screen: NEGATIVE

## 2017-06-01 MED ORDER — CEFOXITIN SODIUM-DEXTROSE 2-2.2 GM-% IV SOLR (PREMIX)
2.0000 g | INTRAVENOUS | Status: AC
  Administered 2017-06-02: 2 g via INTRAVENOUS

## 2017-06-02 ENCOUNTER — Inpatient Hospital Stay: Payer: Managed Care, Other (non HMO) | Admitting: Anesthesiology

## 2017-06-02 ENCOUNTER — Ambulatory Visit: Payer: Managed Care, Other (non HMO) | Admitting: Anesthesiology

## 2017-06-02 ENCOUNTER — Observation Stay
Admission: AD | Admit: 2017-06-02 | Discharge: 2017-06-04 | DRG: 743 | Disposition: A | Discharge status: Home / Self Care | Payer: Managed Care, Other (non HMO) | Source: Ambulatory Visit | Attending: Obstetrics and Gynecology | Admitting: Obstetrics and Gynecology

## 2017-06-02 ENCOUNTER — Encounter
Admission: AD | Disposition: A | Discharge status: Home / Self Care | Payer: Self-pay | Source: Ambulatory Visit | Attending: Obstetrics and Gynecology

## 2017-06-02 ENCOUNTER — Encounter: Payer: Self-pay | Admitting: *Deleted

## 2017-06-02 DIAGNOSIS — N920 Excessive and frequent menstruation with regular cycle: Principal | ICD-10-CM | POA: Insufficient documentation

## 2017-06-02 DIAGNOSIS — N99820 Postprocedural hemorrhage and hematoma of a genitourinary system organ or structure following a genitourinary system procedure: Secondary | ICD-10-CM | POA: Diagnosis not present

## 2017-06-02 DIAGNOSIS — G5601 Carpal tunnel syndrome, right upper limb: Secondary | ICD-10-CM | POA: Insufficient documentation

## 2017-06-02 DIAGNOSIS — N837 Hematoma of broad ligament: Secondary | ICD-10-CM | POA: Diagnosis not present

## 2017-06-02 DIAGNOSIS — Z87891 Personal history of nicotine dependence: Secondary | ICD-10-CM | POA: Insufficient documentation

## 2017-06-02 DIAGNOSIS — N72 Inflammatory disease of cervix uteri: Secondary | ICD-10-CM | POA: Diagnosis present

## 2017-06-02 DIAGNOSIS — Z9889 Other specified postprocedural states: Secondary | ICD-10-CM

## 2017-06-02 HISTORY — PX: REPAIR VAGINAL CUFF: SHX6067

## 2017-06-02 HISTORY — DX: Unspecified asthma, uncomplicated: J45.909

## 2017-06-02 HISTORY — PX: BILATERAL SALPINGECTOMY: SHX5743

## 2017-06-02 HISTORY — PX: LAPAROSCOPY: SHX197

## 2017-06-02 HISTORY — DX: Panic disorder (episodic paroxysmal anxiety): F41.0

## 2017-06-02 HISTORY — PX: VAGINAL HYSTERECTOMY: SHX2639

## 2017-06-02 LAB — CBC
HEMATOCRIT: 36.9 % (ref 35.0–47.0)
HEMATOCRIT: 34.1 % — AB (ref 35.0–47.0)
Hemoglobin: 12.2 g/dL (ref 12.0–16.0)
Hemoglobin: 11.2 g/dL — ABNORMAL LOW (ref 12.0–16.0)
MCH: 27.6 pg (ref 26.0–34.0)
MCH: 27.7 pg (ref 26.0–34.0)
MCHC: 33 g/dL (ref 32.0–36.0)
MCHC: 32.9 g/dL (ref 32.0–36.0)
MCV: 83.8 fL (ref 80.0–100.0)
MCV: 84.4 fL (ref 80.0–100.0)
PLATELETS: 313 10*3/uL (ref 150–440)
PLATELETS: 354 10*3/uL (ref 150–440)
RBC: 4.41 MIL/uL (ref 3.80–5.20)
RBC: 4.04 MIL/uL (ref 3.80–5.20)
RDW: 15.2 % — AB (ref 11.5–14.5)
RDW: 15.1 % — AB (ref 11.5–14.5)
WBC: 15.1 10*3/uL — AB (ref 3.6–11.0)
WBC: 14.8 10*3/uL — AB (ref 3.6–11.0)

## 2017-06-02 LAB — BASIC METABOLIC PANEL
Anion gap: 9 (ref 5–15)
BUN: 10 mg/dL (ref 6–20)
CHLORIDE: 105 mmol/L (ref 101–111)
CO2: 22 mmol/L (ref 22–32)
CREATININE: 0.78 mg/dL (ref 0.44–1.00)
Calcium: 8.7 mg/dL — ABNORMAL LOW (ref 8.9–10.3)
GFR calc Af Amer: >60 mL/min (ref >60)
GFR calc non Af Amer: >60 mL/min (ref >60)
GLUCOSE: 157 mg/dL — AB (ref 65–99)
POTASSIUM: 3.8 mmol/L (ref 3.5–5.1)
SODIUM: 136 mmol/L (ref 135–145)

## 2017-06-02 LAB — POCT PREGNANCY, URINE: Preg Test, Ur: NEGATIVE

## 2017-06-02 LAB — ABO/RH: ABO/RH(D): A POS

## 2017-06-02 SURGERY — HYSTERECTOMY, VAGINAL
Anesthesia: General | Wound class: Clean Contaminated

## 2017-06-02 SURGERY — LAPAROSCOPY, DIAGNOSTIC
Anesthesia: General | Site: Vagina | Wound class: Clean Contaminated

## 2017-06-02 MED ORDER — LACTATED RINGERS IV SOLN
INTRAVENOUS | Status: DC
  Administered 2017-06-02 – 2017-06-04 (×3): via INTRAVENOUS

## 2017-06-02 MED ORDER — MIDAZOLAM HCL 2 MG/2ML IJ SOLN
INTRAMUSCULAR | Status: DC | PRN
  Administered 2017-06-02: 2 mg via INTRAVENOUS

## 2017-06-02 MED ORDER — ONDANSETRON HCL 4 MG/2ML IJ SOLN
INTRAMUSCULAR | Status: DC | PRN
  Administered 2017-06-02: 4 mg via INTRAVENOUS

## 2017-06-02 MED ORDER — MIDAZOLAM HCL 2 MG/2ML IJ SOLN
INTRAMUSCULAR | Status: AC
  Filled 2017-06-02: qty 2

## 2017-06-02 MED ORDER — ONDANSETRON HCL 4 MG/2ML IJ SOLN
4.0000 mg | Freq: Four times a day (QID) | INTRAMUSCULAR | Status: DC | PRN
  Administered 2017-06-02 (×2): 4 mg via INTRAVENOUS
  Filled 2017-06-02 (×2): qty 2

## 2017-06-02 MED ORDER — LIDOCAINE-EPINEPHRINE 1 %-1:100000 IJ SOLN
INTRAMUSCULAR | Status: DC | PRN
  Administered 2017-06-02: 10 mL

## 2017-06-02 MED ORDER — FENTANYL CITRATE (PF) 100 MCG/2ML IJ SOLN
INTRAMUSCULAR | Status: AC
  Filled 2017-06-02: qty 2

## 2017-06-02 MED ORDER — SODIUM CHLORIDE 0.9% FLUSH: 9.0000 mL | INTRAVENOUS | Status: DC | PRN

## 2017-06-02 MED ORDER — SUCCINYLCHOLINE CHLORIDE 20 MG/ML IJ SOLN
INTRAMUSCULAR | Status: AC
  Filled 2017-06-02: qty 1

## 2017-06-02 MED ORDER — FENTANYL CITRATE (PF) 100 MCG/2ML IJ SOLN
25.0000 ug | INTRAMUSCULAR | Status: DC | PRN
  Administered 2017-06-02 (×2): 50 ug via INTRAVENOUS

## 2017-06-02 MED ORDER — DEXAMETHASONE SODIUM PHOSPHATE 10 MG/ML IJ SOLN
INTRAMUSCULAR | Status: DC | PRN
  Administered 2017-06-02: 10 mg via INTRAVENOUS

## 2017-06-02 MED ORDER — LIDOCAINE HCL (PF) 2 % IJ SOLN
INTRAMUSCULAR | Status: AC
  Filled 2017-06-02: qty 2

## 2017-06-02 MED ORDER — ONDANSETRON HCL 4 MG PO TABS: 4.0000 mg | ORAL_TABLET | Freq: Four times a day (QID) | ORAL | Status: DC | PRN

## 2017-06-02 MED ORDER — ONDANSETRON HCL 4 MG/2ML IJ SOLN
INTRAMUSCULAR | Status: AC
  Filled 2017-06-02: qty 2

## 2017-06-02 MED ORDER — ACETAMINOPHEN NICU IV SYRINGE 10 MG/ML
INTRAVENOUS | Status: AC
  Filled 2017-06-02: qty 1

## 2017-06-02 MED ORDER — LIDOCAINE HCL (CARDIAC) 20 MG/ML IV SOLN
INTRAVENOUS | Status: DC | PRN
  Administered 2017-06-02: 80 mg via INTRAVENOUS

## 2017-06-02 MED ORDER — LIDOCAINE HCL (CARDIAC) 20 MG/ML IV SOLN
INTRAVENOUS | Status: DC | PRN
  Administered 2017-06-02: 100 mg via INTRAVENOUS

## 2017-06-02 MED ORDER — ROCURONIUM BROMIDE 100 MG/10ML IV SOLN
INTRAVENOUS | Status: DC | PRN
  Administered 2017-06-02: 10 mg via INTRAVENOUS
  Administered 2017-06-02: 5 mg via INTRAVENOUS
  Administered 2017-06-02: 25 mg via INTRAVENOUS
  Administered 2017-06-02: 10 mg via INTRAVENOUS

## 2017-06-02 MED ORDER — SUCCINYLCHOLINE CHLORIDE 20 MG/ML IJ SOLN
INTRAMUSCULAR | Status: DC | PRN
  Administered 2017-06-02: 100 mg via INTRAVENOUS

## 2017-06-02 MED ORDER — HYDROMORPHONE HCL 1 MG/ML IJ SOLN
INTRAMUSCULAR | Status: AC
  Filled 2017-06-02: qty 1

## 2017-06-02 MED ORDER — OXYCODONE HCL 5 MG/5ML PO SOLN: 5.0000 mg | Freq: Once | ORAL | Status: DC | PRN

## 2017-06-02 MED ORDER — KETOROLAC TROMETHAMINE 30 MG/ML IJ SOLN
INTRAMUSCULAR | Status: AC
  Filled 2017-06-02: qty 1

## 2017-06-02 MED ORDER — SUGAMMADEX SODIUM 500 MG/5ML IV SOLN
INTRAVENOUS | Status: DC | PRN
  Administered 2017-06-02: 200 mg via INTRAVENOUS

## 2017-06-02 MED ORDER — PROPOFOL 10 MG/ML IV BOLUS
INTRAVENOUS | Status: AC
  Filled 2017-06-02: qty 20

## 2017-06-02 MED ORDER — CEFOXITIN SODIUM-DEXTROSE 2-2.2 GM-% IV SOLR (PREMIX)
INTRAVENOUS | Status: AC
  Filled 2017-06-02: qty 50

## 2017-06-02 MED ORDER — SUGAMMADEX SODIUM 500 MG/5ML IV SOLN
INTRAVENOUS | Status: DC | PRN
  Administered 2017-06-02: 180 mg via INTRAVENOUS

## 2017-06-02 MED ORDER — LACTATED RINGERS IV SOLN
INTRAVENOUS | Status: DC
  Administered 2017-06-02: 07:00:00 via INTRAVENOUS

## 2017-06-02 MED ORDER — FENTANYL CITRATE (PF) 100 MCG/2ML IJ SOLN
INTRAMUSCULAR | Status: AC
  Administered 2017-06-02: 25 ug via INTRAVENOUS
  Filled 2017-06-02: qty 2

## 2017-06-02 MED ORDER — DEXTROSE 5 % IV SOLN
2.0000 g | Freq: Four times a day (QID) | INTRAVENOUS | Status: DC
  Administered 2017-06-02 – 2017-06-04 (×8): 2 g via INTRAVENOUS
  Filled 2017-06-02 (×12): qty 2

## 2017-06-02 MED ORDER — SUGAMMADEX SODIUM 200 MG/2ML IV SOLN
INTRAVENOUS | Status: AC
  Filled 2017-06-02: qty 2

## 2017-06-02 MED ORDER — MORPHINE SULFATE (PF) 2 MG/ML IV SOLN
1.0000 mg | INTRAVENOUS | Status: DC | PRN
  Administered 2017-06-02 (×3): 2 mg via INTRAVENOUS
  Filled 2017-06-02 (×3): qty 1

## 2017-06-02 MED ORDER — GENTAMICIN SULFATE 40 MG/ML IJ SOLN
5.0000 mg/kg | INTRAVENOUS | Status: DC
  Administered 2017-06-02 – 2017-06-03 (×2): 440 mg via INTRAVENOUS
  Filled 2017-06-02 (×3): qty 11

## 2017-06-02 MED ORDER — SODIUM CHLORIDE 0.9 % IV SOLN
INTRAVENOUS | Status: DC | PRN
  Administered 2017-06-02: 19:00:00 via INTRAVENOUS

## 2017-06-02 MED ORDER — LABETALOL HCL 5 MG/ML IV SOLN
INTRAVENOUS | Status: AC
  Filled 2017-06-02: qty 4

## 2017-06-02 MED ORDER — OXYCODONE HCL 5 MG PO TABS: 5.0000 mg | ORAL_TABLET | Freq: Once | ORAL | Status: DC | PRN

## 2017-06-02 MED ORDER — FENTANYL CITRATE (PF) 100 MCG/2ML IJ SOLN
INTRAMUSCULAR | Status: DC | PRN
  Administered 2017-06-02: 100 ug via INTRAVENOUS

## 2017-06-02 MED ORDER — DIPHENHYDRAMINE HCL 50 MG/ML IJ SOLN: 12.5000 mg | Freq: Four times a day (QID) | INTRAMUSCULAR | Status: DC | PRN

## 2017-06-02 MED ORDER — CLINDAMYCIN PHOSPHATE 900 MG/50ML IV SOLN
900.0000 mg | Freq: Three times a day (TID) | INTRAVENOUS | Status: DC
  Administered 2017-06-02 – 2017-06-04 (×5): 900 mg via INTRAVENOUS
  Filled 2017-06-02 (×7): qty 50

## 2017-06-02 MED ORDER — ONDANSETRON HCL 4 MG/2ML IJ SOLN: 4.0000 mg | Freq: Four times a day (QID) | INTRAMUSCULAR | Status: DC | PRN

## 2017-06-02 MED ORDER — ROCURONIUM BROMIDE 100 MG/10ML IV SOLN
INTRAVENOUS | Status: DC | PRN
  Administered 2017-06-02: 30 mg via INTRAVENOUS
  Administered 2017-06-02 (×2): 10 mg via INTRAVENOUS

## 2017-06-02 MED ORDER — MORPHINE SULFATE 2 MG/ML IV SOLN
INTRAVENOUS | Status: DC
  Administered 2017-06-02: 2 mg via INTRAVENOUS
  Administered 2017-06-03: 9.98 mg via INTRAVENOUS
  Administered 2017-06-03: 10.85 mg via INTRAVENOUS
  Filled 2017-06-02 (×3): qty 25
  Filled 2017-06-02: qty 30

## 2017-06-02 MED ORDER — GLYCOPYRROLATE 0.2 MG/ML IJ SOLN
INTRAMUSCULAR | Status: AC
  Filled 2017-06-02: qty 1

## 2017-06-02 MED ORDER — DEXAMETHASONE SODIUM PHOSPHATE 10 MG/ML IJ SOLN
INTRAMUSCULAR | Status: AC
  Filled 2017-06-02: qty 1

## 2017-06-02 MED ORDER — FLEET ENEMA 7-19 GM/118ML RE ENEM
1.0000 | ENEMA | Freq: Once | RECTAL | Status: AC
  Administered 2017-06-02: 1 via RECTAL

## 2017-06-02 MED ORDER — KETOROLAC TROMETHAMINE 30 MG/ML IJ SOLN
INTRAMUSCULAR | Status: DC | PRN
  Administered 2017-06-02: 30 mg via INTRAVENOUS

## 2017-06-02 MED ORDER — LIDOCAINE-EPINEPHRINE 1 %-1:100000 IJ SOLN
INTRAMUSCULAR | Status: AC
  Filled 2017-06-02: qty 1

## 2017-06-02 MED ORDER — FENTANYL CITRATE (PF) 100 MCG/2ML IJ SOLN
INTRAMUSCULAR | Status: DC | PRN
  Administered 2017-06-02: 100 ug via INTRAVENOUS
  Administered 2017-06-02 (×2): 50 ug via INTRAVENOUS

## 2017-06-02 MED ORDER — LACTATED RINGERS IV SOLN
INTRAVENOUS | Status: DC | PRN
  Administered 2017-06-02 (×2): via INTRAVENOUS

## 2017-06-02 MED ORDER — HYDROMORPHONE HCL 1 MG/ML IJ SOLN
INTRAMUSCULAR | Status: DC | PRN
  Administered 2017-06-02 (×2): 0.5 mg via INTRAVENOUS

## 2017-06-02 MED ORDER — MEPERIDINE HCL 50 MG/ML IJ SOLN: 6.2500 mg | INTRAMUSCULAR | Status: DC | PRN

## 2017-06-02 MED ORDER — SODIUM CHLORIDE 0.9 % IJ SOLN
INTRAMUSCULAR | Status: AC
  Filled 2017-06-02: qty 10

## 2017-06-02 MED ORDER — BUPIVACAINE HCL 0.5 % IJ SOLN
INTRAMUSCULAR | Status: DC | PRN
  Administered 2017-06-02: 9 mL

## 2017-06-02 MED ORDER — OXYCODONE-ACETAMINOPHEN 5-325 MG PO TABS
1.0000 | ORAL_TABLET | ORAL | Status: DC | PRN
  Administered 2017-06-02 (×2): 2 via ORAL
  Filled 2017-06-02 (×2): qty 2

## 2017-06-02 MED ORDER — ROCURONIUM BROMIDE 50 MG/5ML IV SOLN
INTRAVENOUS | Status: AC
  Filled 2017-06-02: qty 1

## 2017-06-02 MED ORDER — LABETALOL HCL 5 MG/ML IV SOLN
INTRAVENOUS | Status: DC | PRN
  Administered 2017-06-02: 5 mg via INTRAVENOUS

## 2017-06-02 MED ORDER — LACTATED RINGERS IV SOLN: INTRAVENOUS | Status: DC

## 2017-06-02 MED ORDER — SEVOFLURANE IN SOLN
RESPIRATORY_TRACT | Status: AC
  Filled 2017-06-02: qty 250

## 2017-06-02 MED ORDER — EPHEDRINE SULFATE 50 MG/ML IJ SOLN
INTRAMUSCULAR | Status: AC
  Filled 2017-06-02: qty 1

## 2017-06-02 MED ORDER — PROPOFOL 10 MG/ML IV BOLUS
INTRAVENOUS | Status: DC | PRN
  Administered 2017-06-02: 200 mg via INTRAVENOUS

## 2017-06-02 MED ORDER — PROPOFOL 10 MG/ML IV BOLUS
INTRAVENOUS | Status: DC | PRN
  Administered 2017-06-02: 180 mg via INTRAVENOUS

## 2017-06-02 MED ORDER — FENTANYL CITRATE (PF) 100 MCG/2ML IJ SOLN
25.0000 ug | INTRAMUSCULAR | Status: DC | PRN
  Administered 2017-06-02 (×4): 25 ug via INTRAVENOUS

## 2017-06-02 MED ORDER — PROMETHAZINE HCL 25 MG/ML IJ SOLN: 6.2500 mg | INTRAMUSCULAR | Status: DC | PRN

## 2017-06-02 MED ORDER — PHENYLEPHRINE HCL 10 MG/ML IJ SOLN
INTRAMUSCULAR | Status: AC
  Filled 2017-06-02: qty 1

## 2017-06-02 MED ORDER — DIPHENHYDRAMINE HCL 12.5 MG/5ML PO ELIX
12.5000 mg | ORAL_SOLUTION | Freq: Four times a day (QID) | ORAL | Status: DC | PRN
Start: 2017-06-02 — End: 2017-06-03
  Filled 2017-06-02: qty 5

## 2017-06-02 MED ORDER — ACETAMINOPHEN 10 MG/ML IV SOLN
INTRAVENOUS | Status: DC | PRN
  Administered 2017-06-02: 1000 mg via INTRAVENOUS

## 2017-06-02 MED ORDER — NALOXONE HCL 0.4 MG/ML IJ SOLN: 0.4000 mg | INTRAMUSCULAR | Status: DC | PRN

## 2017-06-02 SURGICAL SUPPLY — 68 items
APPLICATOR ARISTA FLEXITIP XL (MISCELLANEOUS) ×4 IMPLANT
BAG URO DRAIN 2000ML W/SPOUT (MISCELLANEOUS) ×4 IMPLANT
BLADE SURG SZ11 CARB STEEL (BLADE) ×4 IMPLANT
CANISTER SUCT 1200ML W/VALVE (MISCELLANEOUS) ×4 IMPLANT
CATH FOLEY 2WAY  5CC 16FR (CATHETERS)
CATH ROBINSON RED A/P 16FR (CATHETERS) IMPLANT
CATH URTH 16FR FL 2W BLN LF (CATHETERS) IMPLANT
CHLORAPREP W/TINT 26ML (MISCELLANEOUS) ×4 IMPLANT
CLOSURE WOUND 1/2 X4 (GAUZE/BANDAGES/DRESSINGS)
COUNTER NEEDLE 20/40 LG (NEEDLE) ×4 IMPLANT
DEVICE PMI PUNCTURE CLOSURE (MISCELLANEOUS) ×4 IMPLANT
DRAPE PERI LITHO V/GYN (MISCELLANEOUS) ×4 IMPLANT
DRAPE SURG 17X11 SM STRL (DRAPES) IMPLANT
DRAPE UNDER BUTTOCK W/FLU (DRAPES) ×4 IMPLANT
DRSG TEGADERM 2-3/8X2-3/4 SM (GAUZE/BANDAGES/DRESSINGS) IMPLANT
ELECT REM PT RETURN 9FT ADLT (ELECTROSURGICAL) ×4
ELECTRODE REM PT RTRN 9FT ADLT (ELECTROSURGICAL) ×2 IMPLANT
GAUZE SPONGE NON-WVN 2X2 STRL (MISCELLANEOUS) IMPLANT
GLOVE BIO SURGEON STRL SZ 6.5 (GLOVE) ×18 IMPLANT
GLOVE BIO SURGEON STRL SZ8 (GLOVE) ×16 IMPLANT
GLOVE BIO SURGEONS STRL SZ 6.5 (GLOVE) ×6
GLOVE BIOGEL PI IND STRL 7.0 (GLOVE) ×6 IMPLANT
GLOVE BIOGEL PI INDICATOR 7.0 (GLOVE) ×6
GOWN STRL REUS W/ TWL LRG LVL3 (GOWN DISPOSABLE) ×6 IMPLANT
GOWN STRL REUS W/ TWL XL LVL3 (GOWN DISPOSABLE) ×2 IMPLANT
GOWN STRL REUS W/TWL LRG LVL3 (GOWN DISPOSABLE) ×6
GOWN STRL REUS W/TWL XL LVL3 (GOWN DISPOSABLE) ×2
GRASPER SUT TROCAR 14GX15 (MISCELLANEOUS) IMPLANT
HEMOSTAT ARISTA ABSORB 3G PWDR (MISCELLANEOUS) ×4 IMPLANT
IRRIGATION STRYKERFLOW (MISCELLANEOUS) ×2 IMPLANT
IRRIGATOR STRYKERFLOW (MISCELLANEOUS) ×4
IV NS 1000ML (IV SOLUTION) ×2
IV NS 1000ML BAXH (IV SOLUTION) ×2 IMPLANT
KIT PINK PAD W/HEAD ARE REST (MISCELLANEOUS) ×4
KIT PINK PAD W/HEAD ARM REST (MISCELLANEOUS) ×2 IMPLANT
KIT RM TURNOVER CYSTO AR (KITS) ×4 IMPLANT
LABEL OR SOLS (LABEL) ×4 IMPLANT
NDL SAFETY 22GX1.5 (NEEDLE) ×4 IMPLANT
NS IRRIG 500ML POUR BTL (IV SOLUTION) ×4 IMPLANT
PACK BASIN MINOR ARMC (MISCELLANEOUS) ×4 IMPLANT
PACK GYN LAPAROSCOPIC (MISCELLANEOUS) ×4 IMPLANT
PAD OB MATERNITY 4.3X12.25 (PERSONAL CARE ITEMS) ×4 IMPLANT
PAD PREP 24X41 OB/GYN DISP (PERSONAL CARE ITEMS) ×4 IMPLANT
PENCIL ELECTRO HAND CTR (MISCELLANEOUS) ×4 IMPLANT
SCISSORS METZENBAUM CVD 33 (INSTRUMENTS) IMPLANT
SHEARS HARMONIC ACE PLUS 36CM (ENDOMECHANICALS) IMPLANT
SLEEVE ENDOPATH XCEL 5M (ENDOMECHANICALS) ×8 IMPLANT
SPONGE VERSALON 2X2 STRL (MISCELLANEOUS)
SPONGE XRAY 4X4 16PLY STRL (MISCELLANEOUS) ×4 IMPLANT
STRIP CLOSURE SKIN 1/2X4 (GAUZE/BANDAGES/DRESSINGS) IMPLANT
SUT PDS 2-0 27IN (SUTURE) ×4 IMPLANT
SUT VIC AB 0 CT1 18XCR BRD 8 (SUTURE) ×2 IMPLANT
SUT VIC AB 0 CT1 27 (SUTURE) ×2
SUT VIC AB 0 CT1 27XCR 8 STRN (SUTURE) ×2 IMPLANT
SUT VIC AB 0 CT1 36 (SUTURE) ×16 IMPLANT
SUT VIC AB 0 CT1 8-18 (SUTURE) ×2
SUT VIC AB 2-0 SH 27 (SUTURE)
SUT VIC AB 2-0 SH 27XBRD (SUTURE) IMPLANT
SUT VIC AB 2-0 UR6 27 (SUTURE) IMPLANT
SUT VIC AB 4-0 SH 27 (SUTURE)
SUT VIC AB 4-0 SH 27XANBCTRL (SUTURE) IMPLANT
SWABSTK COMLB BENZOIN TINCTURE (MISCELLANEOUS) IMPLANT
SYR CONTROL 10ML (SYRINGE) ×4 IMPLANT
SYRINGE 10CC LL (SYRINGE) ×4 IMPLANT
TROCAR ENDO BLADELESS 11MM (ENDOMECHANICALS) IMPLANT
TROCAR XCEL NON-BLD 5MMX100MML (ENDOMECHANICALS) ×4 IMPLANT
TUBING INSUFFLATOR HI FLOW (MISCELLANEOUS) ×4 IMPLANT
WATER STERILE IRR 1000ML POUR (IV SOLUTION) ×4 IMPLANT

## 2017-06-02 SURGICAL SUPPLY — 33 items
BAG URO DRAIN 2000ML W/SPOUT (MISCELLANEOUS) IMPLANT
CANISTER SUCT 1200ML W/VALVE (MISCELLANEOUS) ×4 IMPLANT
CATH FOLEY 2WAY  5CC 16FR (CATHETERS)
CATH ROBINSON RED A/P 16FR (CATHETERS) ×4 IMPLANT
CATH URTH 16FR FL 2W BLN LF (CATHETERS) IMPLANT
DRAPE PERI LITHO V/GYN (MISCELLANEOUS) ×4 IMPLANT
DRAPE SURG 17X11 SM STRL (DRAPES) ×4 IMPLANT
DRAPE UNDER BUTTOCK W/FLU (DRAPES) ×4 IMPLANT
ELECT REM PT RETURN 9FT ADLT (ELECTROSURGICAL) ×4
ELECTRODE REM PT RTRN 9FT ADLT (ELECTROSURGICAL) ×2 IMPLANT
GLOVE BIO SURGEON STRL SZ8 (GLOVE) ×16 IMPLANT
GLOVE BIOGEL PI IND STRL 6.5 (GLOVE) ×4 IMPLANT
GLOVE BIOGEL PI INDICATOR 6.5 (GLOVE) ×4
GLOVE INDICATOR 7.0 STRL GRN (GLOVE) ×4 IMPLANT
GOWN STRL REUS W/ TWL LRG LVL3 (GOWN DISPOSABLE) ×4 IMPLANT
GOWN STRL REUS W/ TWL XL LVL3 (GOWN DISPOSABLE) ×4 IMPLANT
GOWN STRL REUS W/TWL LRG LVL3 (GOWN DISPOSABLE) ×4
GOWN STRL REUS W/TWL XL LVL3 (GOWN DISPOSABLE) ×4
KIT RM TURNOVER CYSTO AR (KITS) ×4 IMPLANT
LABEL OR SOLS (LABEL) ×4 IMPLANT
NDL SAFETY 22GX1.5 (NEEDLE) ×4 IMPLANT
PACK BASIN MINOR ARMC (MISCELLANEOUS) ×4 IMPLANT
PAD OB MATERNITY 4.3X12.25 (PERSONAL CARE ITEMS) ×4 IMPLANT
PAD PREP 24X41 OB/GYN DISP (PERSONAL CARE ITEMS) ×4 IMPLANT
SUT PDS 2-0 27IN (SUTURE) ×4 IMPLANT
SUT VIC AB 0 CT1 27 (SUTURE) ×8
SUT VIC AB 0 CT1 27XCR 8 STRN (SUTURE) ×8 IMPLANT
SUT VIC AB 0 CT1 36 (SUTURE) ×8 IMPLANT
SUT VIC AB 2-0 SH 27 (SUTURE) ×6
SUT VIC AB 2-0 SH 27XBRD (SUTURE) ×6 IMPLANT
SYR CONTROL 10ML (SYRINGE) ×4 IMPLANT
SYRINGE 10CC LL (SYRINGE) ×4 IMPLANT
WATER STERILE IRR 1000ML POUR (IV SOLUTION) ×4 IMPLANT

## 2017-06-02 NOTE — Brief Op Note (Signed)
06/02/2017  7:54 PM  PATIENT:  Boone MasterJenna B Waldvogel  31 y.o. female  PRE-OPERATIVE DIAGNOSIS:  bleeding  POST-OPERATIVE DIAGNOSIS:  right board ligament hematoma nonexpanding  PROCEDURE:  Procedure(s): LAPAROSCOPY DIAGNOSTIC (N/A) REPAIR VAGINAL CUFF (N/A)  SURGEON:  Surgeon(s) and Role:    * Schermerhorn, Ihor Austinhomas J, MD - Primary  PHYSICIAN ASSISTANT: scrub tech   ASSISTANTS: none   ANESTHESIA:   general  EBL:  Total I/O In: 1000 [I.V.:1000] Out: 50 [Blood:50]  BLOOD ADMINISTERED:none  DRAINS: Urinary Catheter (Foley)   LOCAL MEDICATIONS USED:  MARCAINE     SPECIMEN:  No Specimen  DISPOSITION OF SPECIMEN:  N/A  COUNTS:  YES  TOURNIQUET:  * No tourniquets in log *  DICTATION: .Other Dictation: Dictation Number verbal   PLAN OF CARE: admit to inpatient  PATIENT DISPOSITION:  PACU - hemodynamically stable.   Delay start of Pharmacological VTE agent (>24hrs) due to surgical blood loss or risk of bleeding: not applicable

## 2017-06-02 NOTE — Progress Notes (Signed)
15 minute call to floor. 

## 2017-06-02 NOTE — Progress Notes (Signed)
Pt. Sitting upright in bed eating ice chips, wide awake.

## 2017-06-02 NOTE — Op Note (Signed)
[name], Lindsey                ACCOUNT NO.:  [id number]  MEDICAL RECORD NO.:  [id number]  LOCATION:  [facility]                         FACILITY:  [facility]  PHYSICIAN:  Jennell Cornerhomas Schermerhorn, MDDATE OF BIRTH:  19-Aug-1986  DATE OF PROCEDURE:  06/02/2017 DATE OF DISCHARGE:                              OPERATIVE REPORT   PREOPERATIVE DIAGNOSIS:  Menorrhagia.  POSTOPERATIVE DIAGNOSIS:  Menorrhagia.  PROCEDURE PERFORMED: 1. Total vaginal hysterectomy. 2. Bilateral salpingectomy.  SURGEON:  Jennell Cornerhomas Schermerhorn, MD  ANESTHESIA:  General endotracheal anesthesia.  SURGEON:  Jennell Cornerhomas Schermerhorn, MD.  FIRST ASSISTANT:  Dalbert GarnetBeasley.  INDICATIONS:  This is a 31 year old gravida 1, para 1 patient with a greater than 7-year history of heavy uterine bleeding, uncontrolled with conservative management of the patient.  The patient elects for definitive surgical intervention.  DESCRIPTION OF PROCEDURE:  After adequate general endotracheal anesthesia, the patient was placed in dorsal supine position.  The patient's legs were placed in the candy-cane stirrups.  The patient's abdomen, perineum, and vagina were prepped and draped in normal sterile fashion.  The patient did receive 2 g IV cefoxitin prior to commencement of the case.  Time-out was performed.  Straight catheterization of the bladder yielded 250 mL clear urine.  A weighted speculum was placed in the posterior vaginal vault.  The anterior cervix was grasped with 2 thyroid tenacula.  Cervix was circumferentially injected with 1% lidocaine with 1:100,000 epinephrine.  A direct posterior colpotomy incision was made upon entry into the posterior cul-de-sac.  The long billed weighted speculum was placed.  The uterosacral ligaments were bilaterally clamped, transected, suture ligated with 0 Vicryl suture. The anterior cervix was circumferentially incised with the Bovie.  The anterior cul-de-sac was entered sharply.  The Deaver retractor was placed  within to reflect the bladder anteriorly.  The cardinal ligaments were bilaterally clamped, transected, suture ligated with 0 Vicryl suture.  The uterine arteries were bilaterally clamped, transected, suture ligated with 0 Vicryl suture.  Sequential bites of the broad ligament followed and ultimately the cornua were bilaterally clamped, transected, and each pedicle was doubly ligated with 0 Vicryl suture. Each fallopian tube was identified and clamped and removed and a single 0 Vicryl suture was used to secure the pedicle.  Good hemostasis was noted.  The ovaries appeared normal.  The peritoneum was then closed with a 2-0 PDS pursestring suture, and the vaginal cuff was closed with a running 0 Vicryl suture.  Good hemostasis was noted.  A Foley catheter was placed at the end of the case yielding 50 mL of additional urine.  TOTAL URINE OUTPUT:  300 mL.  INTRAOPERATIVE FLUIDS:  700 mL.  ESTIMATED BLOOD LOSS:  100 mL.  The patient tolerated the procedure well and was taken to recovery room in good condition.          ______________________________ Jennell Cornerhomas Schermerhorn, MD     TS/MEDQ  D:  06/02/2017  T:  06/02/2017  Job:  [id number]

## 2017-06-02 NOTE — Anesthesia Preprocedure Evaluation (Signed)
Anesthesia Evaluation  Patient identified by MRN, date of birth, ID band Patient awake    Reviewed: Allergy & Precautions, NPO status , Patient's Chart, lab work & pertinent test results  History of Anesthesia Complications Negative for: history of anesthetic complications  Airway Mallampati: III  TM Distance: >3 FB Neck ROM: Full    Dental no notable dental hx.    Pulmonary asthma , former smoker,    breath sounds clear to auscultation- rhonchi (-) wheezing      Cardiovascular Exercise Tolerance: Good (-) hypertension(-) CAD, (-) Past MI and (-) Cardiac Stents  Rhythm:Regular Rate:Normal - Systolic murmurs and - Diastolic murmurs    Neuro/Psych  Headaches, Anxiety    GI/Hepatic Neg liver ROS, GERD  ,  Endo/Other  negative endocrine ROSneg diabetes  Renal/GU negative Renal ROS     Musculoskeletal negative musculoskeletal ROS (+)   Abdominal (+) + obese,   Peds  Hematology negative hematology ROS (+)   Anesthesia Other Findings Past Medical History: No date: Asthma     Comment:  EXERCISE INDUCED No date: Carpal tunnel syndrome, right No date: GERD (gastroesophageal reflux disease) No date: Headache     Comment:  migraine No date: Panic attacks   Reproductive/Obstetrics                             Anesthesia Physical Anesthesia Plan  ASA: II  Anesthesia Plan: General   Post-op Pain Management:    Induction: Intravenous  PONV Risk Score and Plan: 2 and Ondansetron and Dexamethasone  Airway Management Planned: Oral ETT  Additional Equipment:   Intra-op Plan:   Post-operative Plan: Extubation in OR  Informed Consent: I have reviewed the patients History and Physical, chart, labs and discussed the procedure including the risks, benefits and alternatives for the proposed anesthesia with the patient or authorized representative who has indicated his/her understanding and  acceptance.   Dental advisory given  Plan Discussed with: CRNA and Anesthesiologist  Anesthesia Plan Comments:         Anesthesia Quick Evaluation

## 2017-06-02 NOTE — Anesthesia Procedure Notes (Signed)
Performed by: Irving BurtonBACHICH, Dynesha Woolen

## 2017-06-02 NOTE — Transfer of Care (Signed)
Immediate Anesthesia Transfer of Care Note  Patient: Lindsey Clark  Procedure(s) Performed: Procedure(s): LAPAROSCOPY DIAGNOSTIC (N/A) REPAIR VAGINAL CUFF (N/A)  Patient Location: PACU  Anesthesia Type:General  Level of Consciousness: awake, alert , oriented and patient cooperative  Airway & Oxygen Therapy: Patient Spontanous Breathing  Post-op Assessment: Report given to RN and Post -op Vital signs reviewed and stable  Post vital signs: Reviewed and stable  Last Vitals:  Vitals:   06/02/17 1400 06/02/17 1510  BP: 134/89   Pulse: 91 87  Resp: 20 18  Temp: 36.9 C 36.9 C    Last Pain:  Vitals:   06/02/17 1736  TempSrc:   PainSc: 7          Complications: No apparent anesthesia complications

## 2017-06-02 NOTE — Anesthesia Postprocedure Evaluation (Signed)
Anesthesia Post Note  Patient: Boone MasterJenna B Winstanley  Procedure(s) Performed: Procedure(s) (LRB): HYSTERECTOMY VAGINAL (N/A) BILATERAL SALPINGECTOMY (Bilateral)  Patient location during evaluation: PACU Anesthesia Type: General Level of consciousness: awake and alert and oriented Pain management: pain level controlled Vital Signs Assessment: post-procedure vital signs reviewed and stable Respiratory status: spontaneous breathing, nonlabored ventilation and respiratory function stable Cardiovascular status: blood pressure returned to baseline and stable Postop Assessment: no signs of nausea or vomiting Anesthetic complications: no     Last Vitals:  Vitals:   06/02/17 0947 06/02/17 1055  BP: 115/83 133/87  Pulse: 77 81  Resp: 17 18  Temp: 36.7 C 36.7 C    Last Pain:  Vitals:   06/02/17 1055  TempSrc: Oral  PainSc:                  Haniah Penny

## 2017-06-02 NOTE — Progress Notes (Signed)
[facility] . Labs reviewed . NPO all questions answered

## 2017-06-02 NOTE — Anesthesia Post-op Follow-up Note (Cosign Needed)
Anesthesia QCDR form completed.        

## 2017-06-02 NOTE — Anesthesia Preprocedure Evaluation (Signed)
Anesthesia Evaluation  Patient identified by MRN, date of birth, ID band Patient awake    Reviewed: Allergy & Precautions, NPO status , Patient's Chart, lab work & pertinent test results  History of Anesthesia Complications Negative for: history of anesthetic complications  Airway Mallampati: III  TM Distance: >3 FB Neck ROM: Full    Dental no notable dental hx.    Pulmonary asthma , former smoker,    breath sounds clear to auscultation- rhonchi (-) wheezing      Cardiovascular Exercise Tolerance: Good (-) hypertension(-) CAD, (-) Past MI and (-) Cardiac Stents  Rhythm:Regular Rate:Normal - Systolic murmurs and - Diastolic murmurs    Neuro/Psych  Headaches, Anxiety  Neuromuscular disease    GI/Hepatic Neg liver ROS, GERD  ,  Endo/Other  negative endocrine ROSneg diabetes  Renal/GU negative Renal ROS     Musculoskeletal negative musculoskeletal ROS (+)   Abdominal (+) + obese,   Peds  Hematology negative hematology ROS (+)   Anesthesia Other Findings Past Medical History: No date: Asthma     Comment:  EXERCISE INDUCED No date: Carpal tunnel syndrome, right No date: GERD (gastroesophageal reflux disease) No date: Headache     Comment:  migraine No date: Panic attacks   Reproductive/Obstetrics                             Anesthesia Physical  Anesthesia Plan  ASA: III and emergent  Anesthesia Plan: General ETT, Cricoid Pressure and Rapid Sequence   Post-op Pain Management:    Induction: Intravenous  PONV Risk Score and Plan: 3 and Ondansetron, Dexamethasone, Propofol and Midazolam  Airway Management Planned: Oral ETT  Additional Equipment:   Intra-op Plan:   Post-operative Plan: Extubation in OR  Informed Consent: I have reviewed the patients History and Physical, chart, labs and discussed the procedure including the risks, benefits and alternatives for the proposed  anesthesia with the patient or authorized representative who has indicated his/her understanding and acceptance.   Dental advisory given  Plan Discussed with: CRNA and Anesthesiologist  Anesthesia Plan Comments: (Patient being brought emergently back to OR after surgery this morning   Patient consented for risks of anesthesia including but not limited to:  - adverse reactions to medications - damage to teeth, lips or other oral mucosa - sore throat or hoarseness - Damage to heart, brain, lungs or loss of life  Patient voiced understanding.)        Anesthesia Quick Evaluation

## 2017-06-02 NOTE — Anesthesia Postprocedure Evaluation (Signed)
Anesthesia Post Note  Patient: Lindsey Clark  Procedure(s) Performed: Procedure(s) (LRB): LAPAROSCOPY DIAGNOSTIC (N/A) REPAIR VAGINAL CUFF (N/A)  Patient location during evaluation: PACU Anesthesia Type: General Level of consciousness: awake and alert Pain management: pain level controlled Vital Signs Assessment: post-procedure vital signs reviewed and stable Respiratory status: spontaneous breathing, nonlabored ventilation, respiratory function stable and patient connected to nasal cannula oxygen Cardiovascular status: blood pressure returned to baseline and stable Postop Assessment: no signs of nausea or vomiting Anesthetic complications: no     Last Vitals:  Vitals:   06/02/17 2144 06/02/17 2215  BP:  118/78  Pulse:  (!) 101  Resp: 18 17  Temp:  36.6 C    Last Pain:  Vitals:   06/02/17 2215  TempSrc: Oral  PainSc:                  Cleda MccreedyJoseph K Frederico Gerling

## 2017-06-02 NOTE — Anesthesia Procedure Notes (Signed)
Procedure Name: Intubation Date/Time: 06/02/2017 6:10 PM Performed by: Waldo LaineJUSTIS, Lindsey Trantham Pre-anesthesia Checklist: Patient identified, Patient being monitored, Timeout performed, Emergency Drugs available and Suction available Patient Re-evaluated:Patient Re-evaluated prior to induction Oxygen Delivery Method: Circle system utilized Preoxygenation: Pre-oxygenation with 100% oxygen Induction Type: IV induction, Rapid sequence and Cricoid Pressure applied Laryngoscope Size: Miller and 2 Grade View: Grade I Tube type: Oral Tube size: 7.0 mm Number of attempts: 1 Airway Equipment and Method: Stylet Placement Confirmation: ETT inserted through vocal cords under direct vision,  positive ETCO2 and breath sounds checked- equal and bilateral Secured at: 21 cm Tube secured with: Tape Dental Injury: Teeth and Oropharynx as per pre-operative assessment

## 2017-06-02 NOTE — Progress Notes (Signed)
Patient transferred to OR at this time.   Oswald HillockAbigail Garner, RN

## 2017-06-02 NOTE — Transfer of Care (Signed)
Immediate Anesthesia Transfer of Care Note  Patient: Lindsey Clark  Procedure(s) Performed: Procedure(s): HYSTERECTOMY VAGINAL (N/A) BILATERAL SALPINGECTOMY (Bilateral)  Patient Location: PACU  Anesthesia Type:General  Level of Consciousness: awake, alert  and oriented  Airway & Oxygen Therapy: Patient Spontanous Breathing and Patient connected to face mask oxygen  Post-op Assessment: Report given to RN and Post -op Vital signs reviewed and stable  Post vital signs: Reviewed and stable  Last Vitals:  Vitals:   06/02/17 0617  BP: 140/86  Pulse: 78  Resp: 18  Temp: 36.9 C    Last Pain:  Vitals:   06/02/17 0617  TempSrc: Oral  PainSc: 0-No pain         Complications: No apparent anesthesia complications

## 2017-06-02 NOTE — Progress Notes (Signed)
Day of Surgery Procedure(s) (LRB): HYSTERECTOMY VAGINAL (N/A) BILATERAL SALPINGECTOMY (Bilateral)  Subjective: Patient reports  Pt with increasing pain . She has passed several clots vaginally  Objective: I have reviewed patient's vital signs and intake and output. Results for orders placed or performed during the hospital encounter of 06/02/17 (from the past 24 hour(s))  Pregnancy, urine POC     Status: None   Collection Time: 06/02/17  6:27 AM  Result Value Ref Range   Preg Test, Ur NEGATIVE NEGATIVE  ABO/Rh     Status: None   Collection Time: 06/02/17  6:35 AM  Result Value Ref Range   ABO/RH(D) A POS   Basic metabolic panel     Status: Abnormal   Collection Time: 06/02/17  2:35 PM  Result Value Ref Range   Sodium 136 135 - 145 mmol/L   Potassium 3.8 3.5 - 5.1 mmol/L   Chloride 105 101 - 111 mmol/L   CO2 22 22 - 32 mmol/L   Glucose, Bld 157 (H) 65 - 99 mg/dL   BUN 10 6 - 20 mg/dL   Creatinine, Ser 8.650.78 0.44 - 1.00 mg/dL   Calcium 8.7 (L) 8.9 - 10.3 mg/dL   GFR calc non Af Amer >60 >60 mL/min   GFR calc Af Amer >60 >60 mL/min   Anion gap 9 5 - 15  CBC     Status: Abnormal   Collection Time: 06/02/17  2:35 PM  Result Value Ref Range   WBC 15.1 (H) 3.6 - 11.0 K/uL   RBC 4.41 3.80 - 5.20 MIL/uL   Hemoglobin 12.2 12.0 - 16.0 g/dL   HCT 78.436.9 69.635.0 - 29.547.0 %   MCV 83.8 80.0 - 100.0 fL   MCH 27.6 26.0 - 34.0 pg   MCHC 33.0 32.0 - 36.0 g/dL   RDW 28.415.2 (H) 13.211.5 - 44.014.5 %   Platelets 313 150 - 440 K/uL   pelvic Pelvic 15 cc clots in vagina and blood on perineum  Assessment: s/p Procedure(s): HYSTERECTOMY VAGINAL (N/A) BILATERAL SALPINGECTOMY (Bilateral):  Vaginal bleeding  Plan: to take pt back to the OR perform a L/S to see if bleeding can be identified . A repair vaginal cuff again   LOS: 0 days    Ihor Austinhomas J Roosvelt Churchwell 06/02/2017, 5:48 PM

## 2017-06-02 NOTE — Brief Op Note (Signed)
06/02/2017  8:52 AM  PATIENT:  Lindsey Clark  31 y.o. female  PRE-OPERATIVE DIAGNOSIS:  menorrhagia  POST-OPERATIVE DIAGNOSIS:  menorrhagia  PROCEDURE:  Procedure(s): HYSTERECTOMY VAGINAL (N/A) BILATERAL SALPINGECTOMY (Bilateral)  SURGEON:  Surgeon(s) and Role:    * Schermerhorn, Ihor Austinhomas J, MD - Primary    * Christeen DouglasBeasley, Bethany, MD - Assisting  PHYSICIAN ASSISTANT:   ASSISTANTS: scrub tech ANESTHESIA:   general  EBL:  Total I/O In: -  Out: 100 [Blood:100]  BLOOD ADMINISTERED:none  DRAINS: Urinary Catheter (Foley)   LOCAL MEDICATIONS USED:  LIDOCAINE   SPECIMEN:  Source of Specimen:  cervix , uterus and bilateral fallopian tubes   DISPOSITION OF SPECIMEN:  PATHOLOGY  COUNTS:  YES  TOURNIQUET:  * No tourniquets in log *  DICTATION: .Other Dictation: Dictation Number verbal  PLAN OF CARE: Admit for overnight observation  PATIENT DISPOSITION:  PACU - hemodynamically stable.   Delay start of Pharmacological VTE agent (>24hrs) due to surgical blood loss or risk of bleeding: not applicable

## 2017-06-02 NOTE — Anesthesia Procedure Notes (Signed)
Procedure Name: Intubation Date/Time: 06/02/2017 7:35 AM Performed by: Ginger CarneMICHELET, Tillmon Kisling Pre-anesthesia Checklist: Patient identified, Emergency Drugs available, Suction available, Patient being monitored and Timeout performed Patient Re-evaluated:Patient Re-evaluated prior to induction Oxygen Delivery Method: Circle system utilized Preoxygenation: Pre-oxygenation with 100% oxygen Induction Type: IV induction Ventilation: Mask ventilation without difficulty Tube type: Oral Tube size: 7.0 mm Number of attempts: 1 Airway Equipment and Method: Stylet Placement Confirmation: ETT inserted through vocal cords under direct vision,  positive ETCO2 and breath sounds checked- equal and bilateral Secured at: 21 cm Tube secured with: Tape Dental Injury: Teeth and Oropharynx as per pre-operative assessment

## 2017-06-03 ENCOUNTER — Encounter: Payer: Self-pay | Admitting: Obstetrics and Gynecology

## 2017-06-03 DIAGNOSIS — N920 Excessive and frequent menstruation with regular cycle: Secondary | ICD-10-CM | POA: Diagnosis not present

## 2017-06-03 LAB — SURGICAL PATHOLOGY

## 2017-06-03 MED ORDER — ONDANSETRON 4 MG PO TBDP: 4.0000 mg | ORAL_TABLET | Freq: Four times a day (QID) | ORAL | Status: DC | PRN

## 2017-06-03 MED ORDER — OXYCODONE-ACETAMINOPHEN 5-325 MG PO TABS
1.0000 | ORAL_TABLET | ORAL | Status: DC | PRN
  Administered 2017-06-03 – 2017-06-04 (×7): 2 via ORAL
  Filled 2017-06-03 (×7): qty 2

## 2017-06-03 MED ORDER — PANTOPRAZOLE SODIUM 40 MG PO TBEC
40.0000 mg | DELAYED_RELEASE_TABLET | Freq: Two times a day (BID) | ORAL | Status: DC | PRN
  Administered 2017-06-03 – 2017-06-04 (×3): 40 mg via ORAL
  Filled 2017-06-03 (×4): qty 1

## 2017-06-03 NOTE — Op Note (Signed)
NAMEloise Harman:  Fauteux, Lemon                ACCOUNT NO.:  [id number]  MEDICAL RECORD NO.:  [id number]  LOCATION:  [facility]                         FACILITY:  [facility]  PHYSICIAN:  Jennell Cornerhomas Mulan Adan, MDDATE OF BIRTH:  January 27, 1986  DATE OF PROCEDURE:06/02/17 DATE OF DISCHARGE:                              OPERATIVE REPORT   PREOPERATIVE DIAGNOSIS:  Vaginal cuff bleeding, status post total vaginal hysterectomy.  POSTOPERATIVE DIAGNOSIS:  Right broad ligament hematoma, nonexpanding.  PROCEDURE PERFORMED: 1. Diagnostic laparoscopy. 2. Repair of vaginal cuff.  SURGEON:  Jennell Cornerhomas Jaydon Soroka, MD  ANESTHESIA:  General endotracheal anesthesia.  SURGEON:  Jennell Cornerhomas Moani Weipert, MD  FIRST ASSISTANT:  Scrub tech.  INDICATIONS:  This is a 31 year old female status post vaginal hysterectomy.  On the day of reoperation, the patient developed right- sided pain and vaginal bleeding.  Hematocrit was stable.  DESCRIPTION OF PROCEDURE:  After adequate general endotracheal anesthesia, the patient was placed in the dorsal supine position with the legs in the AhtanumAllen stirrups.  Abdomen, perineum, and vagina were prepped and draped in normal sterile fashion.  Time-out was performed. The patient received 2 g IV cefoxitin prior to commencement of the case. A 6 mm infraumbilical incision was made after injecting with 0.5% Marcaine.  The 5 mm laparoscope was advanced into the abdominal cavity under direct visualization with the Optiview cannula.  The patient's abdomen was insufflated with carbon dioxide.  Second port site was placed in left lower quadrant 3 cm medial to the left anterior iliac spine, and port was advanced under direct visualization.  Initial impression revealed that there was a right broad ligament hematoma approximately 5 x 4 cm not passing the pelvic brim.  Third port site was placed in right lower quadrant 3 cm medial to the right anterior iliac spine.  Direct visualization of the vaginal cuff  revealed no active bleeding.  At this point, surgeon proceeded to the vaginal area, where the vaginal cuff was intact, and there was approximately 4 mL of clotted blood.  Cuff was open, removing prior sutures, and there was no active bleeding that was noted.  The cuff was then reclosed with a running 0 Vicryl suture and then 4 additional interrupted 0 Vicryl sutures used to reinforce the vaginal cuff.  Gloves were changed, and surgeon then went to the laparoscope again.  Lower pelvis was irrigated and direct visualization of the vaginal cuff revealed no active bleeding.  At this point, the procedure was terminated.  The patient's abdomen was deflated, and each port site was closed with interrupted 4-0 Vicryl suture.  Sterile dressing applied.  There were no complications.  ESTIMATED BLOOD LOSS:  50 mL.  INTRAOPERATIVE FLUIDS:  1100 mL.  URINE OUTPUT:  100 mL.  The patient tolerated the procedure well.          ______________________________ Jennell Cornerhomas Eurika Sandy, MD     TS/MEDQ  D:  06/02/2017  T:  06/03/2017  Job:  [id number]

## 2017-06-03 NOTE — Progress Notes (Signed)
1 Day Post-Op Procedure(s) (LRB): LAPAROSCOPY DIAGNOSTIC (N/A) REPAIR VAGINAL CUFF (N/A) TVH and follow on surgery as above .  Right broad ligament hematoma  Subjective: pain is better No significant vaginal bleeding    Objective: I have reviewed patient's vital signs, intake and output, medications and labs. Results for orders placed or performed during the hospital encounter of 06/02/17 (from the past 24 hour(s))  Basic metabolic panel     Status: Abnormal   Collection Time: 06/02/17  2:35 PM  Result Value Ref Range   Sodium 136 135 - 145 mmol/L   Potassium 3.8 3.5 - 5.1 mmol/L   Chloride 105 101 - 111 mmol/L   CO2 22 22 - 32 mmol/L   Glucose, Bld 157 (H) 65 - 99 mg/dL   BUN 10 6 - 20 mg/dL   Creatinine, Ser 1.610.78 0.44 - 1.00 mg/dL   Calcium 8.7 (L) 8.9 - 10.3 mg/dL   GFR calc non Af Amer >60 >60 mL/min   GFR calc Af Amer >60 >60 mL/min   Anion gap 9 5 - 15  CBC     Status: Abnormal   Collection Time: 06/02/17  2:35 PM  Result Value Ref Range   WBC 15.1 (H) 3.6 - 11.0 K/uL   RBC 4.41 3.80 - 5.20 MIL/uL   Hemoglobin 12.2 12.0 - 16.0 g/dL   HCT 09.636.9 04.535.0 - 40.947.0 %   MCV 83.8 80.0 - 100.0 fL   MCH 27.6 26.0 - 34.0 pg   MCHC 33.0 32.0 - 36.0 g/dL   RDW 81.115.2 (H) 91.411.5 - 78.214.5 %   Platelets 313 150 - 440 K/uL  CBC     Status: Abnormal   Collection Time: 06/02/17  8:16 PM  Result Value Ref Range   WBC 14.8 (H) 3.6 - 11.0 K/uL   RBC 4.04 3.80 - 5.20 MIL/uL   Hemoglobin 11.2 (L) 12.0 - 16.0 g/dL   HCT 95.634.1 (L) 21.335.0 - 08.647.0 %   MCV 84.4 80.0 - 100.0 fL   MCH 27.7 26.0 - 34.0 pg   MCHC 32.9 32.0 - 36.0 g/dL   RDW 57.815.1 (H) 46.911.5 - 62.914.5 %   Platelets 354 150 - 440 K/uL   Physical :  CV RRR  Lungs CTA  Abd: no ecchymoses , active BS  Mild TTP RLQ    Assessment:/ Plan   right broad ligament hematoma . Stable today  Concern for future abscess formation given possible seeding from vag acct  Cont ABX D/cfoley  Saline lock     LOS: 1 day    Ihor Austinhomas J  Candance Bohlman 06/03/2017, 1:47 PM

## 2017-06-04 DIAGNOSIS — N920 Excessive and frequent menstruation with regular cycle: Secondary | ICD-10-CM | POA: Diagnosis not present

## 2017-06-04 LAB — HIV ANTIBODY (ROUTINE TESTING W REFLEX): HIV Screen 4th Generation wRfx: NONREACTIVE

## 2017-06-04 MED ORDER — POLYETHYLENE GLYCOL 3350 17 G PO PACK: 17.0000 g | PACK | Freq: Every day | ORAL | 0 refills | Status: AC

## 2017-06-04 MED ORDER — IBUPROFEN 800 MG PO TABS: 800.0000 mg | ORAL_TABLET | Freq: Three times a day (TID) | ORAL | 0 refills | Status: AC | PRN

## 2017-06-04 MED ORDER — DOCUSATE SODIUM 100 MG PO CAPS: 100.0000 mg | ORAL_CAPSULE | Freq: Every day | ORAL | 2 refills | Status: AC | PRN

## 2017-06-04 MED ORDER — ONDANSETRON HCL 4 MG PO TABS: 4.0000 mg | ORAL_TABLET | Freq: Every day | ORAL | 1 refills | Status: AC | PRN

## 2017-06-04 MED ORDER — OXYCODONE-ACETAMINOPHEN 5-325 MG PO TABS: 1.0000 | ORAL_TABLET | Freq: Four times a day (QID) | ORAL | 0 refills | Status: AC | PRN

## 2017-06-04 MED ORDER — FLUCONAZOLE 100 MG PO TABS: 100.0000 mg | ORAL_TABLET | ORAL | 1 refills | Status: AC

## 2017-06-04 MED ORDER — ONDANSETRON 4 MG PO TBDP: 4.0000 mg | ORAL_TABLET | Freq: Four times a day (QID) | ORAL | 0 refills | Status: AC | PRN

## 2017-06-04 MED ORDER — AMOXICILLIN-POT CLAVULANATE 875-125 MG PO TABS: 1.0000 | ORAL_TABLET | Freq: Two times a day (BID) | ORAL | 0 refills | Status: AC

## 2017-06-04 NOTE — Progress Notes (Signed)
Discharge instructions reviewed with patient.  All questions answered.

## 2017-06-04 NOTE — Discharge Summary (Signed)
Physician Discharge Summary  Patient ID: Lindsey Clark MRN: [id number] DOB/AGE: Oct 13, 1986 31 y.o.  Admit date: 06/02/2017 Discharge date: 06/04/2017  Admission Diagnoses:menorrhagia   Discharge Diagnoses: menorrhagia, right broad ligament hematoma Active Problems:   Post-operative state   Discharged Condition: stable  Hospital Course: underwent TVH + bilateral salpingectomy and reoperated that same day for vaginal bleeding . + 5 cm right right broad ligament hematoma , cuff repaired . IV gent and clindamycin start for prophylaxis .  POD#2 still with right lower abd pain managed with PO percocet .  Spoke with IR radiologists Dorthula Rue who recommend 2 week CTSCAN once the hematoma liquifies and then possible drainage  PO Augmentin 875 mg bid home   Consults: IR as above   Significant Diagnostic Studies: labs:  Results for orders placed or performed during the hospital encounter of 06/02/17 (from the past 72 hour(s))  Pregnancy, urine POC     Status: None   Collection Time: 06/02/17  6:27 AM  Result Value Ref Range   Preg Test, Ur NEGATIVE NEGATIVE    Comment:        THE SENSITIVITY OF THIS METHODOLOGY IS >24 mIU/mL   ABO/Rh     Status: None   Collection Time: 06/02/17  6:35 AM  Result Value Ref Range   ABO/RH(D) A POS   Surgical pathology     Status: None   Collection Time: 06/02/17  8:41 AM  Result Value Ref Range   SURGICAL PATHOLOGY      Surgical Pathology CASE: [id number] PATIENT: Lindsey Clark Surgical Pathology Report     SPECIMEN SUBMITTED: A. Uterus, cervix, bilateral tubes  CLINICAL HISTORY: None provided  PRE-OPERATIVE DIAGNOSIS: Menorrhagia  POST-OPERATIVE DIAGNOSIS: Same as pre-op     DIAGNOSIS: A. UTERUS WITH CERVIX AND BILATERAL FALLOPIAN TUBES; HYSTERECTOMY WITH BILATERAL SALPINGECTOMY: - ACUTE AND CHRONIC CERVICITIS. - SECRETORY ENDOMETRIUM. - BILATERAL FALLOPIAN TUBES WITH HEMORRHAGE.   GROSS DESCRIPTION:  A. The specimen  is received in a formalin-filled container labeled with the patient's name and uterus, cervix; bilateral tubes.  Adnexa: bilateral tubes(unattached) Weight: 70 grams Dimensions:      Fundus -4.4 x 4.2 x 3.6 cm      Cervix -3.9 x 4.7 cm Serosa: purple tan predominantly smooth Cervix: smooth pink-tan Endocervix: trabecular pink-tan Endometrial cavity:      Dimensions -3.0 x 2.2 cm      Thickness -0.2 cm      Other findings -none noted Myome trium:     Thickness -1.8 cm     Other findings -none noted Adnexa:       First fallopian tube           Measurements -1.8 x 1.2 x 0.9 cm           Other findings -fimbriated purple tan       Second fallopian tube            Measurements -2.3 x 1.4 x 1.1 cm           Other findings -fimbriated purple tan Other comments: none noted  Block summary: 1-representative anterior cervix 2-representative posterior cervix 3-representative anterior endomyometrium 4-representative posterior endomyometrium 5-cross-sections and longitudinal fimbriated end first fallopian tube entirely submitted 6-cross-sections and longitudinal fimbriated end second fallopian tube  Final Diagnosis performed by Delorse Lek, MD.  Electronically signed 06/03/2017 6:52:32PM    The electronic signature indicates that the named Attending Pathologist has evaluated the specimen  Technical component performed at [facility], [address] [place], [place]  [place] [facility]: [phone] Dir: Darrick Penna. Gunnar Fusi, MD  Professional component performed at [facility], [facility], [place], [place], [place] [address] Lab: [phone] Dir: Dellia Nims. Rubinas, MD    Basic metabolic panel     Status: Abnormal   Collection Time: 06/02/17  2:35 PM  Result Value Ref Range   Sodium 136 135 - 145 mmol/L   Potassium 3.8 3.5 - 5.1 mmol/L   Chloride 105 101 - 111 mmol/L   CO2 22 22 - 32 mmol/L   Glucose, Bld 157 (H) 65 - 99 mg/dL   BUN 10 6 - 20 mg/dL    Creatinine, Ser 0.78 0.44 - 1.00 mg/dL   Calcium 8.7 (L) 8.9 - 10.3 mg/dL   GFR calc non Af Amer >60 >60 mL/min   GFR calc Af Amer >60 >60 mL/min    Comment: (NOTE) The eGFR has been calculated using the CKD EPI equation. This calculation has not been validated in all clinical situations. eGFR's persistently <60 mL/min signify possible Chronic Kidney Disease.    Anion gap 9 5 - 15  CBC     Status: Abnormal   Collection Time: 06/02/17  2:35 PM  Result Value Ref Range   WBC 15.1 (H) 3.6 - 11.0 K/uL   RBC 4.41 3.80 - 5.20 MIL/uL   Hemoglobin 12.2 12.0 - 16.0 g/dL   HCT 36.9 35.0 - 47.0 %   MCV 83.8 80.0 - 100.0 fL   MCH 27.6 26.0 - 34.0 pg   MCHC 33.0 32.0 - 36.0 g/dL   RDW 15.2 (H) 11.5 - 14.5 %   Platelets 313 150 - 440 K/uL  CBC     Status: Abnormal   Collection Time: 06/02/17  8:16 PM  Result Value Ref Range   WBC 14.8 (H) 3.6 - 11.0 K/uL   RBC 4.04 3.80 - 5.20 MIL/uL   Hemoglobin 11.2 (L) 12.0 - 16.0 g/dL   HCT 34.1 (L) 35.0 - 47.0 %   MCV 84.4 80.0 - 100.0 fL   MCH 27.7 26.0 - 34.0 pg   MCHC 32.9 32.0 - 36.0 g/dL   RDW 15.1 (H) 11.5 - 14.5 %   Platelets 354 150 - 440 K/uL  HIV antibody (Routine Testing)     Status: None   Collection Time: 06/03/17  5:43 AM  Result Value Ref Range   HIV Screen 4th Generation wRfx Non Reactive Non Reactive    Comment: (NOTE) Performed At: [facility] [place], [place] [id number] Lindon Romp MD MM:[id number]     Treatments:  Surgery as above  Discharge Exam: Blood pressure 121/75, pulse 82, temperature 98.4 F (36.9 C), temperature source Oral, resp. rate 20, height _0  (1.676 m), weight 88.5 kg (195 lb), last menstrual period 05/07/2017, SpO2 99 %. General appearance: alert and cooperative Resp: clear to auscultation bilaterally Cardio: regular rate and rhythm, S1, S2 normal, no murmur, click, rub or gallop GI: mild TTP RLQ , no ecchymoses   Disposition: 01-Home or Self Care  Discharge  Instructions    Call MD for:    Complete by:  As directed    Additional right pelvic pain   Call MD for:  difficulty breathing, headache or visual disturbances    Complete by:  As directed    Call MD for:  extreme fatigue    Complete by:  As directed    Call MD for:  hives    Complete by:  As directed  Call MD for:  persistant dizziness or light-headedness    Complete by:  As directed    Call MD for:  persistant nausea and vomiting    Complete by:  As directed    Call MD for:  redness, tenderness, or signs of infection (pain, swelling, redness, odor or green/yellow discharge around incision site)    Complete by:  As directed    Call MD for:  severe uncontrolled pain    Complete by:  As directed    Call MD for:  temperature >100.4    Complete by:  As directed    Diet - low sodium heart healthy    Complete by:  As directed    Increase activity slowly    Complete by:  As directed      Allergies as of 06/04/2017      Reactions   Bee Venom Swelling      Medication List    STOP taking these medications   HYDROcodone-acetaminophen 5-325 MG tablet Commonly known as:  NORCO     TAKE these medications   albuterol 108 (90 Base) MCG/ACT inhaler Commonly known as:  PROVENTIL HFA;VENTOLIN HFA Inhale 2 puffs into the lungs every 6 (six) hours as needed for wheezing or shortness of breath.   amoxicillin-clavulanate 875-125 MG tablet Commonly known as:  AUGMENTIN Take 1 tablet by mouth 2 (two) times daily.   docusate sodium 100 MG capsule Commonly known as:  COLACE Take 1 capsule (100 mg total) by mouth daily as needed.   fluconazole 100 MG tablet Commonly known as:  DIFLUCAN Take 1 tablet (100 mg total) by mouth once a week.   ibuprofen 800 MG tablet Commonly known as:  ADVIL,MOTRIN Take 1 tablet (800 mg total) by mouth every 8 (eight) hours as needed.   omeprazole 40 MG capsule Commonly known as:  PRILOSEC Take 40 mg by mouth 2 (two) times daily as needed (takes once  daily andagain if needed for indigestion).   ondansetron 4 MG disintegrating tablet Commonly known as:  ZOFRAN-ODT Take 1 tablet (4 mg total) by mouth every 6 (six) hours as needed for nausea or vomiting.   ondansetron 4 MG tablet Commonly known as:  ZOFRAN Take 1 tablet (4 mg total) by mouth daily as needed for nausea or vomiting.   oxyCODONE-acetaminophen 5-325 MG tablet Commonly known as:  PERCOCET/ROXICET Take 1-2 tablets by mouth every 6 (six) hours as needed for moderate pain or severe pain.   polyethylene glycol packet Commonly known as:  MIRALAX Take 17 g by mouth daily.   SUMAtriptan 25 MG tablet Commonly known as:  IMITREX take 1 tablet by mouth immediately may repeat after 2 hours if needed for migraines MAX OF 200MG PER DAY      Follow-up Information    Schermerhorn, Gwen Her, MD Follow up in 2 week(s).   Specialty:  Obstetrics and Gynecology Why:  follow up after ctscan , which will be ordered in 2 weeks  Contact information: [address] [place] [address] [phone]           Signed: Wilcox 06/04/2017, 1:29 PM

## 2017-06-05 ENCOUNTER — Other Ambulatory Visit: Payer: Self-pay | Admitting: Obstetrics and Gynecology

## 2017-06-05 DIAGNOSIS — N837 Hematoma of broad ligament: Secondary | ICD-10-CM

## 2017-06-19 ENCOUNTER — Ambulatory Visit
Admission: RE | Admit: 2017-06-19 | Discharge: 2017-06-19 | Disposition: A | Discharge status: Home / Self Care | Payer: Managed Care, Other (non HMO) | Source: Ambulatory Visit | Attending: Obstetrics and Gynecology | Admitting: Obstetrics and Gynecology

## 2017-06-19 DIAGNOSIS — N83201 Unspecified ovarian cyst, right side: Secondary | ICD-10-CM | POA: Insufficient documentation

## 2017-06-19 DIAGNOSIS — Z90710 Acquired absence of both cervix and uterus: Secondary | ICD-10-CM | POA: Insufficient documentation

## 2017-06-19 DIAGNOSIS — N837 Hematoma of broad ligament: Secondary | ICD-10-CM | POA: Diagnosis present

## 2017-06-19 DIAGNOSIS — N83202 Unspecified ovarian cyst, left side: Secondary | ICD-10-CM | POA: Insufficient documentation

## 2017-06-19 MED ORDER — IOPAMIDOL (ISOVUE-300) INJECTION 61%
100.0000 mL | Freq: Once | INTRAVENOUS | Status: AC | PRN
  Administered 2017-06-19: 100 mL via INTRAVENOUS

## 2017-12-05 IMAGING — CT CT ABD-PELV W/ CM
2 of 5 series · 15 of 46 positions shown, 17 images · IV contrast (APPLIED)
Comparison: 03/17/2009

CLINICAL DATA: Followup broad ligament hematoma status post
hysterectomy.

EXAM:
CT ABDOMEN AND PELVIS WITH CONTRAST
TECHNIQUE: Multidetector CT imaging of the abdomen and pelvis was performed
using the standard protocol following bolus administration of
intravenous contrast.
CONTRAST:  100mL ZHQDD6-7RR IOPAMIDOL (ZHQDD6-7RR) INJECTION 61%

[Series 2: routine abd/pel with · axial · 0.72mm/px · z∈[-1013,-578]mm · 12 of 101 slices shown, 14 images]
[im 7/101  soft-tissue]
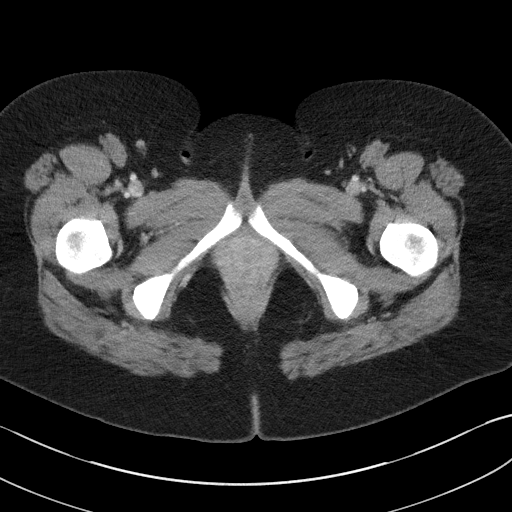
[im 7/101  bone]
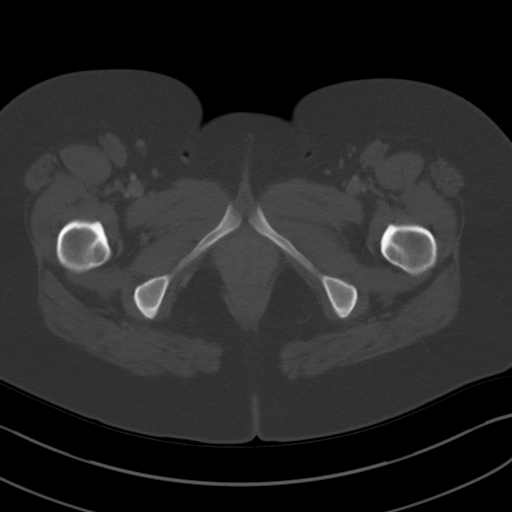
[im 14/101  soft-tissue]
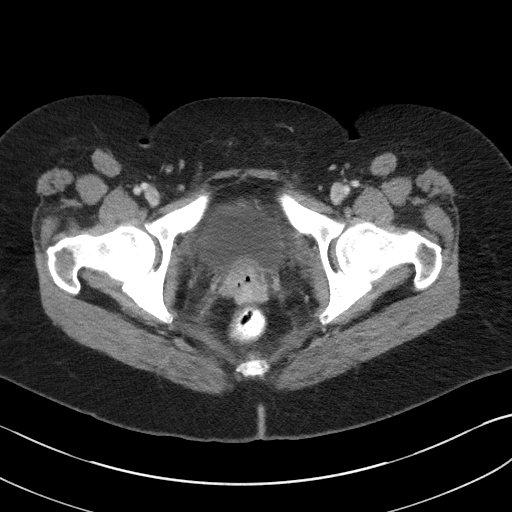
[im 21/101  soft-tissue]
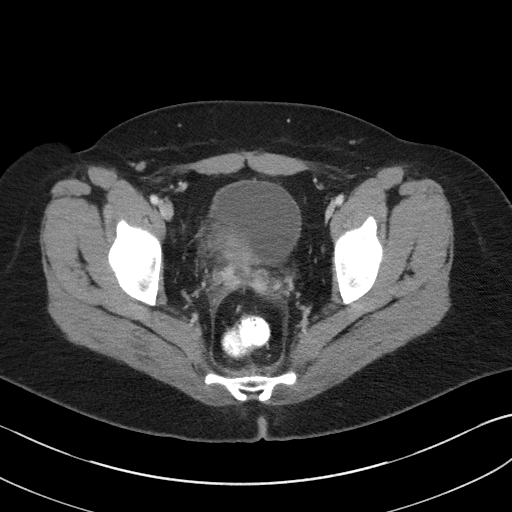
[im 34/101  soft-tissue]
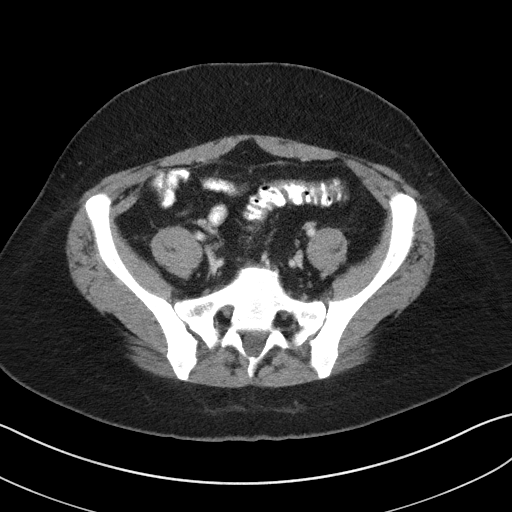
[im 41/101  soft-tissue]
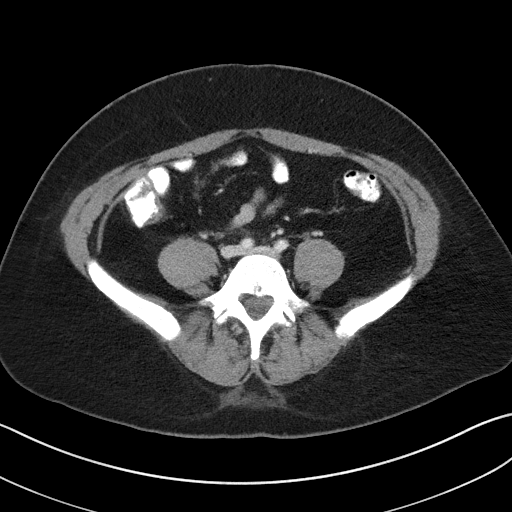
[im 47/101  soft-tissue]
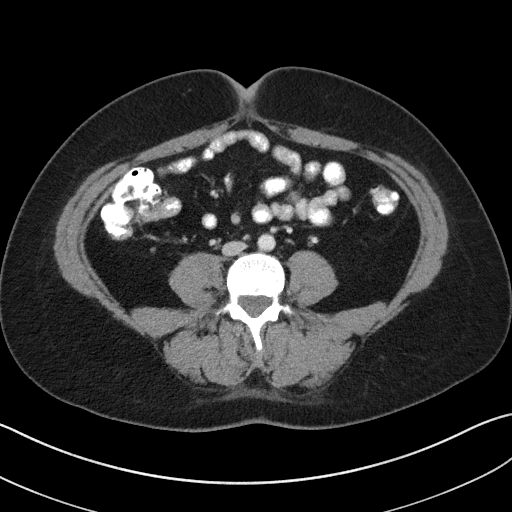
[im 54/101  soft-tissue]
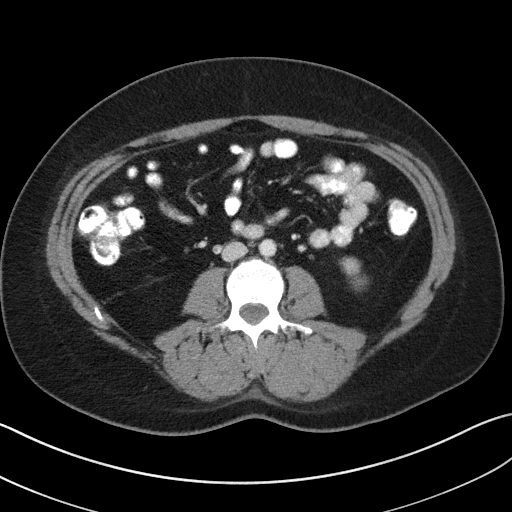
[im 61/101  soft-tissue]
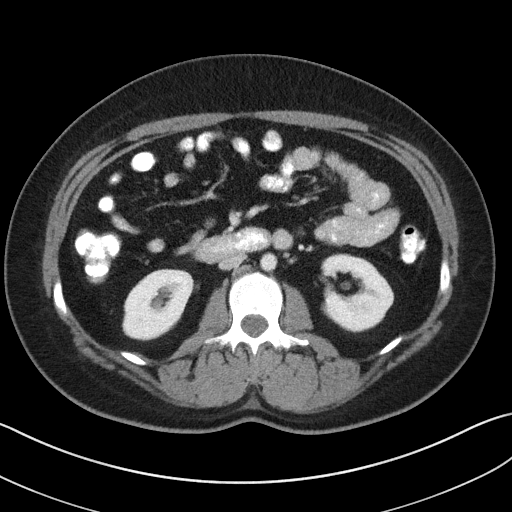
[im 67/101  soft-tissue]
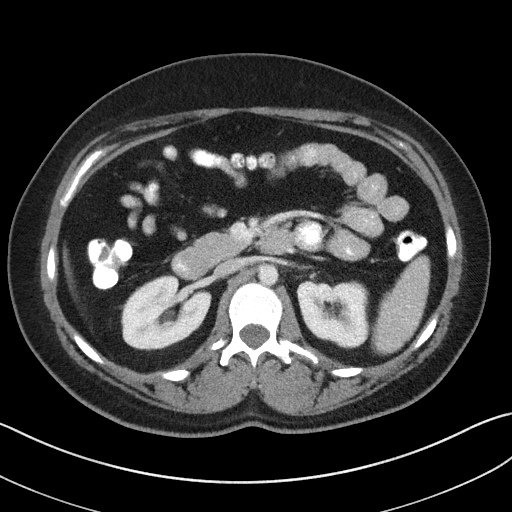
[im 67/101  bone]
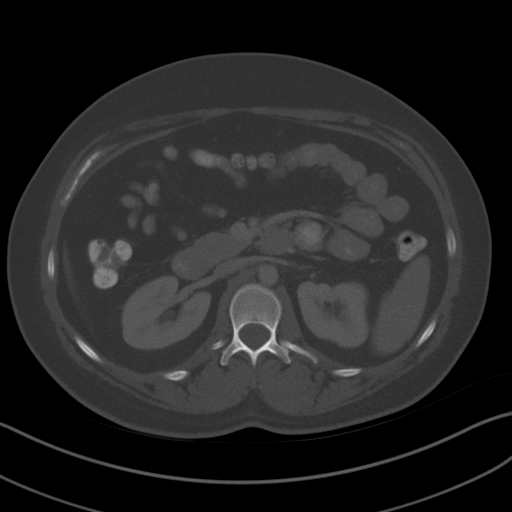
[im 81/101  soft-tissue]
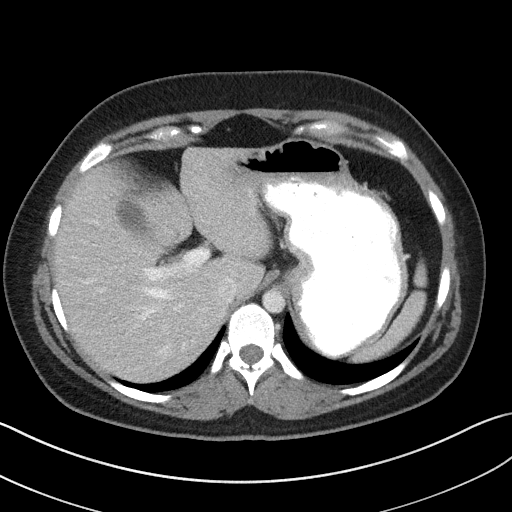
[im 87/101  soft-tissue]
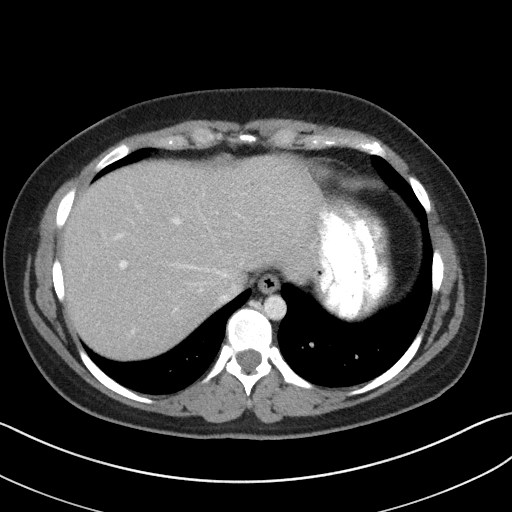
[im 94/101  soft-tissue]
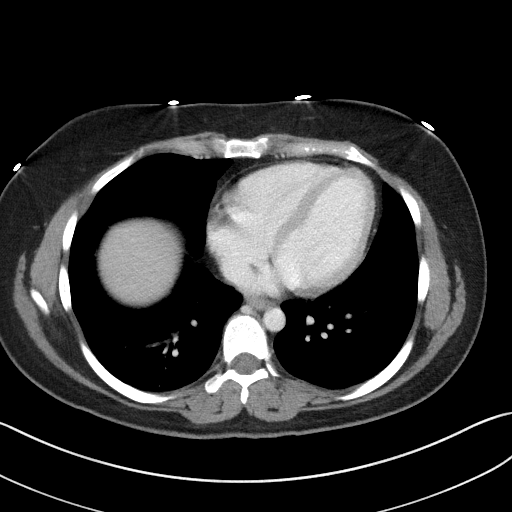

[Series 5: coronal st · coronal · 0.71mm/px · 3 of 84 slices shown]
[im 28/84  soft-tissue]
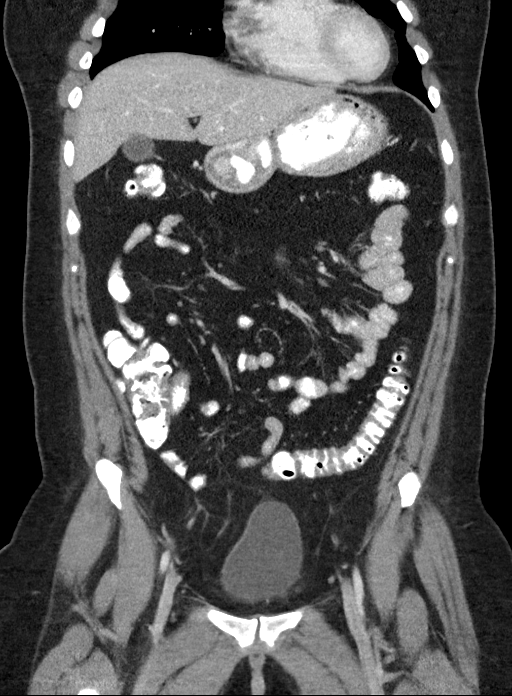
[im 37/84  soft-tissue]
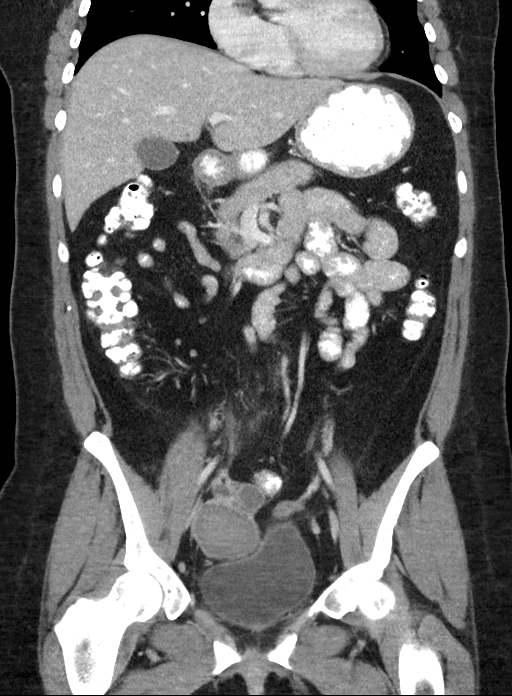
[im 47/84  soft-tissue]
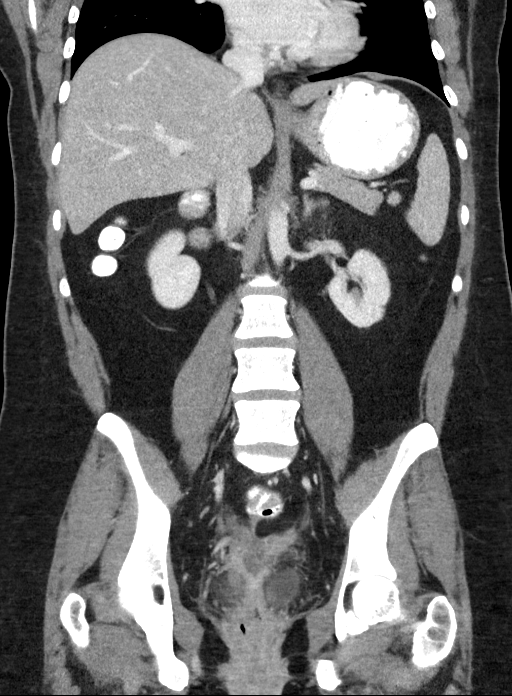

[15 of 46 positions shown; findings below may reference images not displayed]

FINDINGS: Lower chest: The lung bases are clear of acute process. No pleural
effusion or pulmonary lesions. The heart is normal in size. No
pericardial effusion. The distal esophagus and aorta are
unremarkable.

Hepatobiliary: No focal hepatic lesions or intrahepatic biliary
dilatation. The gallbladder is normal. No common bile duct
dilatation.

Pancreas: No mass, inflammation or ductal dilatation.

Spleen: Normal size.  No focal lesions.

Adrenals/Urinary Tract: The adrenal glands and kidneys are normal.
No renal, ureteral or bladder calculi or mass.

Stomach/Bowel: The stomach, duodenum, small bowel and colon are
unremarkable. No acute inflammatory process, mass lesions or
obstructive findings. The terminal ileum is normal. The appendix is
normal.

Vascular/Lymphatic: The aorta is normal in caliber. No dissection.
The branch vessels are patent. The major venous structures are
patent. No mesenteric or retroperitoneal mass or adenopathy. Small
scattered lymph nodes are noted.

Reproductive: Status post hysterectomy. Both ovaries are still
present. Ovarian cysts are noted bilaterally.

5.9 x 4.5 x 3.8 cm lesion noted in the right lower adnexum just
inferior to the right ovary. This is hyperdense measuring 62
Hounsfield units and is most likely postoperative hematoma.

Other: No free pelvic fluid collections. No inguinal mass or
adenopathy. No abdominal wall hernia or subcutaneous lesions.

Musculoskeletal: No significant bony findings.
IMPRESSION: 1. Status posthysterectomy with a right paraovarian hematoma
measuring 5.9 x 4.5 x 3.8 cm. Recommend followup ultrasound
examination to make sure this resolves.
2. Bilateral ovarian cysts/follicles.
3. The remainder of the examination is unremarkable.
